# Patient Record
Sex: Female | Born: 1975
Health system: Southern US, Community
[De-identification: ages and names within clinical notes are randomized; demographics above are authoritative.]

## PROBLEM LIST (undated history)

## (undated) DIAGNOSIS — F419 Anxiety disorder, unspecified: Secondary | ICD-10-CM

## (undated) DIAGNOSIS — L509 Urticaria, unspecified: Secondary | ICD-10-CM

## (undated) HISTORY — DX: Urticaria, unspecified: L50.9

## (undated) HISTORY — PX: TUBAL LIGATION: SHX77

---

## 2004-09-08 ENCOUNTER — Emergency Department (HOSPITAL_COMMUNITY): Admission: EM | Admit: 2004-09-08 | Discharge: 2004-09-08 | Payer: Self-pay | Admitting: Emergency Medicine

## 2005-01-25 ENCOUNTER — Emergency Department (HOSPITAL_COMMUNITY): Admission: EM | Admit: 2005-01-25 | Discharge: 2005-01-25 | Payer: Self-pay | Admitting: Emergency Medicine

## 2008-08-06 ENCOUNTER — Inpatient Hospital Stay (HOSPITAL_COMMUNITY): Admission: AD | Admit: 2008-08-06 | Discharge: 2008-08-07 | Payer: Self-pay | Admitting: Obstetrics & Gynecology

## 2008-09-08 ENCOUNTER — Inpatient Hospital Stay (HOSPITAL_COMMUNITY): Admission: AD | Admit: 2008-09-08 | Discharge: 2008-09-08 | Payer: Self-pay | Admitting: Obstetrics and Gynecology

## 2009-02-27 ENCOUNTER — Inpatient Hospital Stay (HOSPITAL_COMMUNITY): Admission: AD | Admit: 2009-02-27 | Discharge: 2009-02-27 | Payer: Self-pay | Admitting: Obstetrics and Gynecology

## 2009-03-09 ENCOUNTER — Inpatient Hospital Stay (HOSPITAL_COMMUNITY): Admission: AD | Admit: 2009-03-09 | Discharge: 2009-03-12 | Payer: Self-pay | Admitting: Obstetrics & Gynecology

## 2009-03-09 ENCOUNTER — Encounter (INDEPENDENT_AMBULATORY_CARE_PROVIDER_SITE_OTHER): Payer: Self-pay | Admitting: Obstetrics & Gynecology

## 2009-03-18 ENCOUNTER — Ambulatory Visit: Payer: Self-pay | Admitting: Obstetrics and Gynecology

## 2009-03-18 ENCOUNTER — Inpatient Hospital Stay (HOSPITAL_COMMUNITY): Admission: AD | Admit: 2009-03-18 | Discharge: 2009-03-18 | Payer: Self-pay | Admitting: Obstetrics and Gynecology

## 2009-04-04 ENCOUNTER — Ambulatory Visit: Admission: RE | Admit: 2009-04-04 | Discharge: 2009-04-04 | Payer: Self-pay | Admitting: Obstetrics & Gynecology

## 2009-05-26 ENCOUNTER — Emergency Department (HOSPITAL_BASED_OUTPATIENT_CLINIC_OR_DEPARTMENT_OTHER): Admission: EM | Admit: 2009-05-26 | Discharge: 2009-05-26 | Payer: Self-pay | Admitting: Emergency Medicine

## 2010-07-28 ENCOUNTER — Emergency Department (HOSPITAL_BASED_OUTPATIENT_CLINIC_OR_DEPARTMENT_OTHER)
Admission: EM | Admit: 2010-07-28 | Discharge: 2010-07-28 | Payer: Self-pay | Source: Home / Self Care | Admitting: Emergency Medicine

## 2010-10-27 LAB — URINALYSIS, ROUTINE W REFLEX MICROSCOPIC
Hgb urine dipstick: NEGATIVE
Nitrite: NEGATIVE
Specific Gravity, Urine: 1.028 (ref 1.005–1.030)
Urobilinogen, UA: 1 mg/dL (ref 0.0–1.0)
pH: 6.5 (ref 5.0–8.0)

## 2010-11-21 LAB — CBC
Hemoglobin: 8.7 g/dL — ABNORMAL LOW (ref 12.0–15.0)
MCV: 78 fL (ref 78.0–100.0)
RBC: 3.44 MIL/uL — ABNORMAL LOW (ref 3.87–5.11)
WBC: 12.2 10*3/uL — ABNORMAL HIGH (ref 4.0–10.5)

## 2010-11-22 LAB — CBC
HCT: 28.2 % — ABNORMAL LOW (ref 36.0–46.0)
HCT: 34.9 % — ABNORMAL LOW (ref 36.0–46.0)
Hemoglobin: 9.5 g/dL — ABNORMAL LOW (ref 12.0–15.0)
MCHC: 33.8 g/dL (ref 30.0–36.0)
MCV: 77.3 fL — ABNORMAL LOW (ref 78.0–100.0)
RBC: 4.51 MIL/uL (ref 3.87–5.11)
RDW: 16.1 % — ABNORMAL HIGH (ref 11.5–15.5)
WBC: 13.3 10*3/uL — ABNORMAL HIGH (ref 4.0–10.5)

## 2010-11-30 LAB — URINALYSIS, ROUTINE W REFLEX MICROSCOPIC
Bilirubin Urine: NEGATIVE
Hgb urine dipstick: NEGATIVE
Ketones, ur: 40 mg/dL — AB
Protein, ur: NEGATIVE mg/dL
Urobilinogen, UA: 0.2 mg/dL (ref 0.0–1.0)

## 2010-12-29 NOTE — Discharge Summary (Signed)
Joanna Cross, Joanna Cross               ACCOUNT NO.:  [id number]   MEDICAL RECORD NO.:  [id number]          PATIENT TYPE:  INP   LOCATION:  9144                          FACILITY:  [facility]   PHYSICIAN:  Randye Lobo, M.D.   DATE OF BIRTH:  May 16, 1976   DATE OF ADMISSION:  03/09/2009  DATE OF DISCHARGE:  03/12/2009                               DISCHARGE SUMMARY   FINAL DIAGNOSIS:  Intrauterine pregnancy at 38-6/7th weeks' gestation,  history of previous cesarean section.  The patient desires repeat  cesarean section.  The patient desires permanent tubal sterilization,  active labor.   PROCEDURE:  Repeat low-transverse cesarean section and bilateral tubal  ligation.   SURGEON:  Gerrit Friends. Aldona Bar, MD   COMPLICATIONS:  None.   HISTORY OF PRESENT ILLNESS:  This 35 year old G2, P1-0-0-1 had a  previous cesarean section with her last pregnancy and desires repeat  with this pregnancy as well.  The patient's antepartum course up to this  point had been uncomplicated.  She was positive for group B strep.  The  patient had a repeat cesarean section scheduled at 39 weeks' gestation.  The patient started having active labor a day prior.  The patient was  taken to the operating room on March 09, 2009 by Dr. Annamaria Helling where a  repeat low-transverse cesarean section was performed with the delivery  of a 6 pounds 14 ounces female infant with Apgars of 8 and 8.  Delivery  went without complications.  There were some adhesions noted from the  lower segment of the uterus to the peritoneum which were lysed without  difficulty.  The patient had also been given 2 grams of ampicillin prior  to delivery secondary to positive group B strep status.  After delivery,  the patient still expressed her desire for permanent sterilization which  was performed by performing a bilateral tubal ligation.  Procedure and  delivery went without complications.  The patient's postoperative course  was benign without any  significant fevers.  The patient was sent home on  postoperative day #3.  The patient had an allergy to CODEINE.  She was  sent home on ibuprofen and ketorolac.  The patient was also told she  could use ibuprofen as an alternative.  She was to follow up in our  office in 6 weeks.  Instructions and precautions were reviewed with the  patient.   DISCHARGE LABORATORY DATA:  The patient had a hemoglobin of 9.5, white  blood cell count of 17.9, and platelets of 188,000.      Leilani Able, P.A.-C.      Randye Lobo, M.D.  Electronically Signed    MB/MEDQ  D:  03/30/2009  T:  03/31/2009  Job:  [id number]

## 2010-12-29 NOTE — Op Note (Signed)
Joanna Cross, Joanna Cross               ACCOUNT NO.:  [id number]   MEDICAL RECORD NO.:  [id number]          PATIENT TYPE:  INP   LOCATION:  9144                          FACILITY:  [facility]   PHYSICIAN:  Gerrit Friends. Aldona Bar, M.D.   DATE OF BIRTH:  March 27, 1976   DATE OF PROCEDURE:  03/09/2009  DATE OF DISCHARGE:                               OPERATIVE REPORT   PREOPERATIVE DIAGNOSES:  38-week and 6-day intrauterine pregnancy,  previous cesarean section, active labor, desire for repeat cesarean  section and tubal sterilization procedure.   POSTOPERATIVE DIAGNOSES:  38-week and 6-day intrauterine pregnancy,  previous cesarean section, active labor, desire for repeat cesarean  section and tubal sterilization procedure.  Delivery of 6 pounds 14  ounces female infant, Apgars 8 and 8 and pathology pending on segments  of each fallopian tube and adhesions from the lower segment of the  uterus to the peritoneum.   SURGEON:  Gerrit Friends. Aldona Bar, MD   PROCEDURES:  Repeat low transverse cesarean section, lysis of adhesions,  and tubal sterilization procedure.   ANESTHESIA:  Spinal.   HISTORY:  This 35 year old gravida 2, para 1, who had a previous C-  section elsewhere, presented 1 day prior to when she was scheduled for  repeat cesarean section at 45 weeks' gestation - she was 38 weeks and 6  days.  At the time of presentation, was having active contractions,  which had been increasing past 4-5 hours.  She was desirous of  proceeding with a repeat cesarean section as she was in active labor and  she also requested, as mentioned in the office antenatally, a tubal  sterilization procedure.   She was taken to the operating room at this time for delivery by repeat  cesarean section at 38 weeks and 6 days - an active labor - and desirous  of tubal sterilization procedure, understanding such procedure was meant  to be 100% permanent, unfortunately, it was not 100% perfect -  subsequent pregnancy can result.   OPERATIVE PROCEDURE:  The patient did receive 2 g of ampicillin IV in  maternity admissions because she had a positive group B strep culture  antenatally.  She was taken to the operating room, where spinal  anesthetic was carried out without difficulty, and thereafter she was  placed in the supine position slightly tilted left.  She was prepped and  draped in usual fashion with a Foley catheter placed.  She did receive 1  g of Ancef intravenously as well prior to procedure.   Once the patient was adequately draped and good anesthetic levels were  documented, procedure was begun.  A Pfannenstiel incision was made  through the old incision, dissected down sharply to and through the  fascia in a low transverse fashion with hemostasis created at each  layer.  Subfascial space was created inferiorly and superiorly, muscles  separated in midline, peritoneum was identified and entered  appropriately.  It became apparent there were some significant adhesions  from the lower segment of the uterus to the anterior peritoneum, and  after appropriate identification of all structures, these were taken  down sharply, it was permitting exposure to the lower segment to  facilitate delivery.  Once these adhesions were taken down, the bladder  blade was placed, and thereafter using Metzenbaum scissors in low  transverse fashion, the uterine cavity was entered - amniotomy created  with production of clear fluid; and thereafter from vertex position; a  viable female infant, which cried spontaneously once it was delivered.  After the cord was clamped and cut, the infant was passed off the  awaiting team, headed up by Dr. Alison Murray and the baby was subsequently  taken to the nursery in good condition.  Subsequent weight was found to  be 6 pounds 14 ounces and Apgars were noted to be 8 and 8.   Cord bloods were collected.  Placenta was delivered intact.  At this  time, the uterus was exteriorized and rendered free  of any remaining  products of conception.  Good uterine contractility was afforded and was  slowly given intravenous Pitocin and manual stimulation.  The lower  segment was fairly thick, but nonetheless it was closed first using a  single layer of #1 Vicryl in a running locking fashion.  This was  oversewn with multiple figure-of-eight #1 Vicryls for additional  hemostasis and to provide second layer closure.   At this time, the incision was dry.  Attention was turned each fallopian  tube, which appeared normal in its course as did both ovaries.  A  Pomeroy tubal sterilization procedure was carried out in usual fashion -  a knuckle of tube was elevated and a free suture of #1 plain catgut  suture tied down about the knuckle and the knuckle was excised and sent  to Pathology and this was carried out on both the right and left  fallopian tubes.  Hemostasis was adequate at each site.  At this time,  the uterus incision was inspected again and noted to be dry.  Uterus was  replaced in the abdominal cavity after the abdomen lavaged of all free  blood and clot.  All counts at this time noted to be correct and no  foreign bodies noted to be remaining in the abdominal cavity.  At this  time, incision was reinspected as were the tubal sterilization sites and  there were noted to be dry.  Closure of the abdomen was then carried out  in layers.  The abdominal peritoneum was closed with 0 Vicryl in a  running fashion and the muscle secured with same.  Assured of good  subfascial hemostasis, the fascia was reapproximated using 0 Vicryl from  angle to midline bilaterally.  Subcutaneous tissues were rendered  hemostatic and staples were then used to close skin and sterile pressure  dressing was applied.  Estimated blood loss 700 mL.  All counts correct  x2.   Pathologic specimen consist of segment of each fallopian tube.  At the  conclusion of the procedure, both mother and baby were doing well in   respective recovery areas.   In summary, this patient presented at 38 weeks and 6 days as a scheduled  C-section be done on March 10, 2009.  Because she was in active labor, it  was felt that it was indicated to proceed with delivery, and  accordingly, she was taken to the operating room where she underwent a  repeat low transverse cesarean section, lysis of adhesions, and tubal  sterilization procedure.  Again, all counts were correct x2.      Gerrit Friends. Aldona Bar, M.D.  Electronically  Signed     RMW/MEDQ  D:  03/09/2009  T:  03/10/2009  Job:  [id number]

## 2011-02-16 ENCOUNTER — Emergency Department (HOSPITAL_BASED_OUTPATIENT_CLINIC_OR_DEPARTMENT_OTHER)
Admission: EM | Admit: 2011-02-16 | Discharge: 2011-02-16 | Disposition: A | Discharge status: Home / Self Care | Payer: 59 | Attending: Emergency Medicine | Admitting: Emergency Medicine

## 2011-02-16 ENCOUNTER — Emergency Department (INDEPENDENT_AMBULATORY_CARE_PROVIDER_SITE_OTHER): Payer: 59

## 2011-02-16 DIAGNOSIS — W010XXA Fall on same level from slipping, tripping and stumbling without subsequent striking against object, initial encounter: Secondary | ICD-10-CM | POA: Insufficient documentation

## 2011-02-16 DIAGNOSIS — M1710 Unilateral primary osteoarthritis, unspecified knee: Secondary | ICD-10-CM

## 2011-02-16 DIAGNOSIS — M25569 Pain in unspecified knee: Secondary | ICD-10-CM

## 2011-02-16 DIAGNOSIS — IMO0002 Reserved for concepts with insufficient information to code with codable children: Secondary | ICD-10-CM | POA: Insufficient documentation

## 2011-02-16 DIAGNOSIS — X500XXA Overexertion from strenuous movement or load, initial encounter: Secondary | ICD-10-CM

## 2011-02-16 DIAGNOSIS — Y92009 Unspecified place in unspecified non-institutional (private) residence as the place of occurrence of the external cause: Secondary | ICD-10-CM | POA: Insufficient documentation

## 2011-04-21 ENCOUNTER — Emergency Department (HOSPITAL_BASED_OUTPATIENT_CLINIC_OR_DEPARTMENT_OTHER)
Admission: EM | Admit: 2011-04-21 | Discharge: 2011-04-21 | Disposition: A | Discharge status: Home / Self Care | Payer: 59 | Attending: Emergency Medicine | Admitting: Emergency Medicine

## 2011-04-21 ENCOUNTER — Encounter: Payer: Self-pay | Admitting: *Deleted

## 2011-04-21 ENCOUNTER — Emergency Department (INDEPENDENT_AMBULATORY_CARE_PROVIDER_SITE_OTHER): Payer: 59

## 2011-04-21 DIAGNOSIS — M25569 Pain in unspecified knee: Secondary | ICD-10-CM

## 2011-04-21 DIAGNOSIS — R609 Edema, unspecified: Secondary | ICD-10-CM

## 2011-04-21 DIAGNOSIS — M79609 Pain in unspecified limb: Secondary | ICD-10-CM | POA: Insufficient documentation

## 2011-04-21 DIAGNOSIS — M7989 Other specified soft tissue disorders: Secondary | ICD-10-CM | POA: Insufficient documentation

## 2011-04-21 DIAGNOSIS — M79606 Pain in leg, unspecified: Secondary | ICD-10-CM

## 2011-04-21 MED ORDER — IBUPROFEN 800 MG PO TABS: 800.0000 mg | ORAL_TABLET | Freq: Three times a day (TID) | ORAL | Status: AC

## 2011-04-21 NOTE — ED Notes (Signed)
Pt c/o right knee pain and states knot behind right knee

## 2011-04-21 NOTE — ED Provider Notes (Signed)
History     CSN: [id number] Arrival date & time: 04/21/2011  2:48 PM  Chief Complaint  Patient presents with  . Knee Pain   HPI Comments: 6 days of atraumatic right lower leg pain. Patient's issues as a soreness in her calf and behind her right knee denies any history of trauma. She denies any fever, chest pain, shortness of breath. She is able to bear weight and using Aleve for the pain. Today she noticed what she thinks is a knot behind her right knee it is painful to palpation. She is able to range the joint completely and has no weakness, numbness, tingling, fever or vomiting. There is no problems with any of her other joints. She is not currently on birth control.  The history is provided by the patient.    History reviewed. No pertinent past medical history.  Past Surgical History  Procedure Date  . Cesarean section   . Tubal ligation     History reviewed. No pertinent family history.  History  Substance Use Topics  . Smoking status: Never Smoker   . Smokeless tobacco: Not on file  . Alcohol Use: No    OB History    Grav Para Term Preterm Abortions TAB SAB Ect Mult Living                  Review of Systems  Constitutional: Negative for activity change and appetite change.  HENT: Negative for congestion and rhinorrhea.   Eyes: Negative for visual disturbance.  Respiratory: Negative for cough, chest tightness and shortness of breath.   Gastrointestinal: Negative for nausea, vomiting and abdominal pain.  Genitourinary: Negative for dysuria and hematuria.  Musculoskeletal: Positive for myalgias, joint swelling and arthralgias.  Neurological: Negative for weakness, numbness and headaches.  Psychiatric/Behavioral: Negative.     Physical Exam  BP 109/72  Pulse 81  Temp(Src) 98 F (36.7 C) (Oral)  Resp 16  Wt 280 lb (127.007 kg)  SpO2 100%  LMP 04/07/2011  Physical Exam  Constitutional: She is oriented to person, place, and time. She appears well-developed and  well-nourished. No distress.  HENT:  Head: Normocephalic and atraumatic.  Mouth/Throat: Oropharynx is clear and moist. No oropharyngeal exudate.  Eyes: Conjunctivae are normal. Pupils are equal, round, and reactive to light.  Neck: Normal range of motion.  Cardiovascular: Normal rate, regular rhythm and normal heart sounds.   Pulmonary/Chest: Effort normal and breath sounds normal. No respiratory distress.  Abdominal: Soft. There is no tenderness. There is no rebound and no guarding.  Musculoskeletal: Normal range of motion. She exhibits no edema and no tenderness.       TTP R posterior calf with small nodule in popliteal fossa.  +2 DP and PT pulses.  No motor or sensory deficits.  No ligament laxity.  Flexor and extensor function intact.   Neurological: She is alert and oriented to person, place, and time. No cranial nerve deficit.  Skin: Skin is warm.    ED Course  Procedures  MDM Knee and calf pain.  FROM of knee joint without ligament laxity. Neurovascularly intact. Doppler LE to r/o DVT  US Venous Img Lower Unilateral Right  04/21/2011  *RADIOLOGY REPORT*  Clinical Data: Right posterior knee pain/swelling  RIGHT LOWER EXTREMITY VENOUS DUPLEX ULTRASOUND  Technique:  Gray-scale sonography with graded compression, as well as color Doppler and duplex ultrasound, were performed to evaluate the deep venous system of the lower extremity from the level of the common femoral vein through the popliteal  and proximal calf veins. Spectral Doppler was utilized to evaluate flow at rest and with distal augmentation maneuvers.  Comparison:  None.  Findings: The visualized right lower extremity deep venous system appears patent.  Normal compressibility.  Patent color Doppler flow.  Satisfactory spectral Doppler with respiratory variation and response to augmentation.  The greater saphenous vein, where visualized, is patent and compressible.  IMPRESSION: No deep venous thrombosis in the right lower  extremity.  Original Report Authenticated By: Charline Bills, M.D.        Glynn Octave, MD 04/21/11 [phone]

## 2011-05-21 LAB — COMPREHENSIVE METABOLIC PANEL
ALT: 12 U/L (ref 0–35)
CO2: 23 mEq/L (ref 19–32)
Calcium: 9.1 mg/dL (ref 8.4–10.5)
Creatinine, Ser: 0.63 mg/dL (ref 0.4–1.2)
GFR calc non Af Amer: >60 mL/min (ref >60)
Glucose, Bld: 91 mg/dL (ref 70–99)
Sodium: 133 mEq/L — ABNORMAL LOW (ref 135–145)

## 2011-05-21 LAB — URINE MICROSCOPIC-ADD ON

## 2011-05-21 LAB — URINALYSIS, ROUTINE W REFLEX MICROSCOPIC
Ketones, ur: 40 mg/dL — AB
Leukocytes, UA: NEGATIVE
Nitrite: NEGATIVE
pH: 6 (ref 5.0–8.0)

## 2012-07-05 IMAGING — US US EXTREM LOW VENOUS*R*
1 series · 14 of 18 positions shown · non-contrast
Comparison: None.

CLINICAL DATA: Right posterior knee pain/swelling

RIGHT LOWER EXTREMITY VENOUS DUPLEX ULTRASOUND
TECHNIQUE: Gray-scale sonography with graded compression, as well
as color Doppler and duplex ultrasound, were performed to evaluate
the deep venous system of the lower extremity from the level of the
common femoral vein through the popliteal and proximal calf veins.
Spectral Doppler was utilized to evaluate flow at rest and with
distal augmentation maneuvers.

[Series 1: us extrem low venous*right* · 14 of 18 slices shown]
[im 1/18]
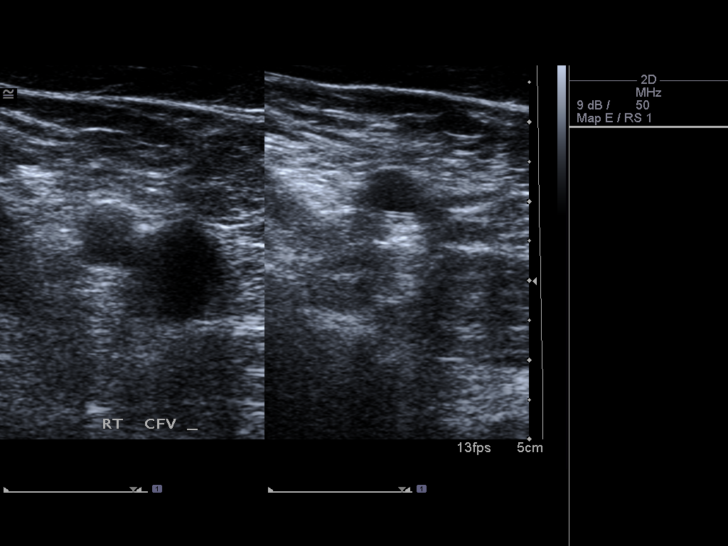
[im 2/18]
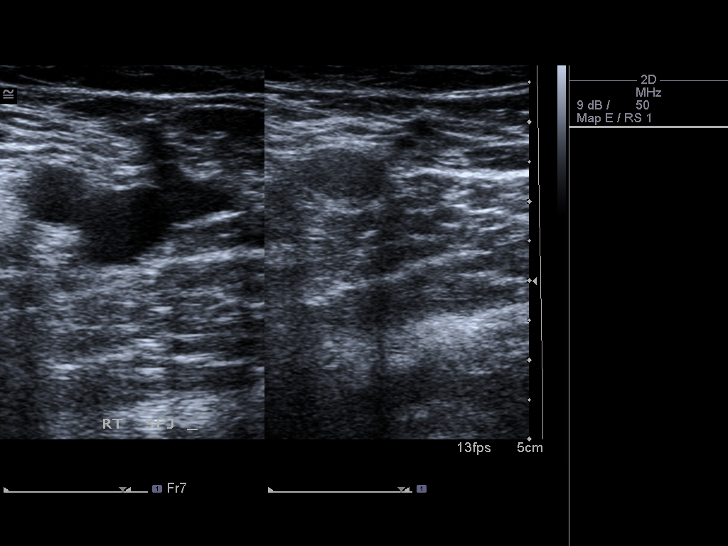
[im 4/18]
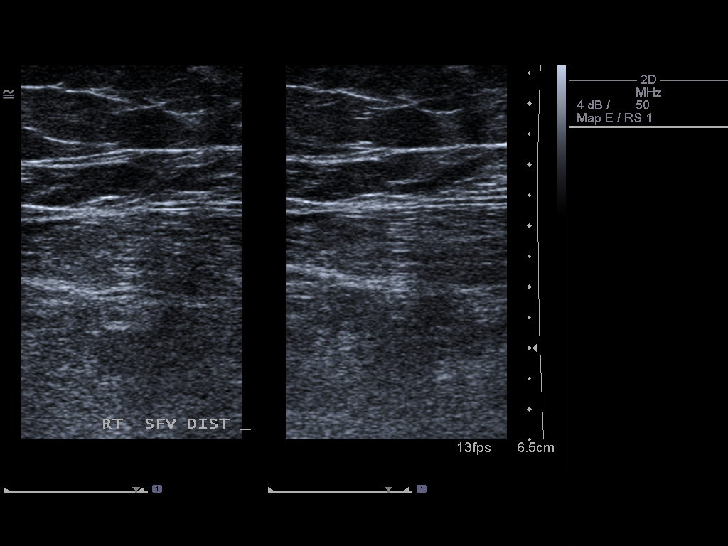
[im 5/18]
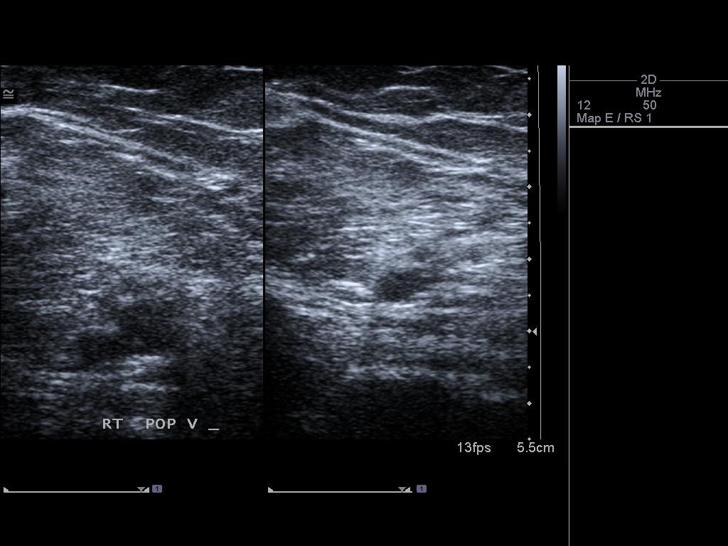
[im 6/18]
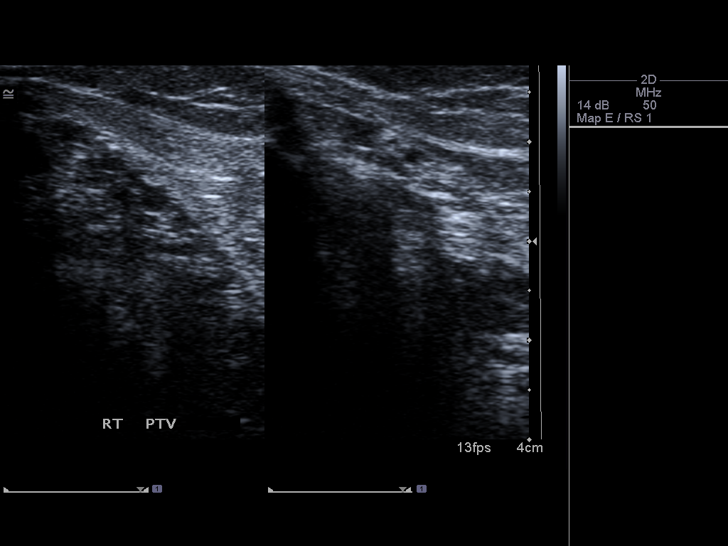
[im 8/18]
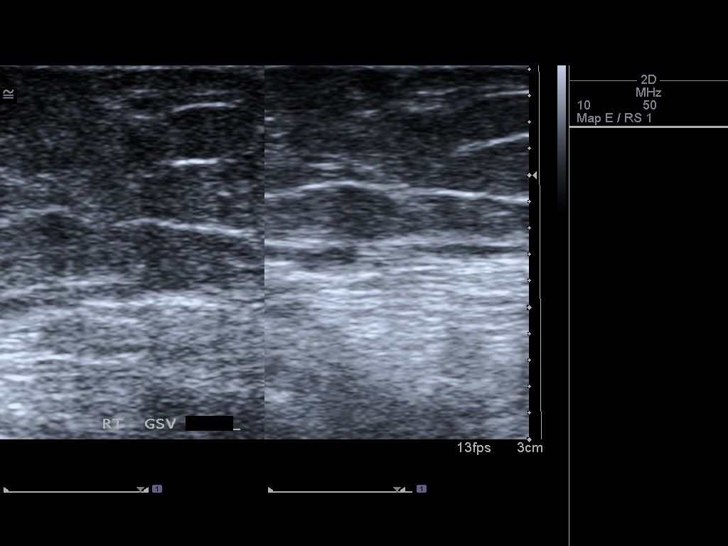
[im 9/18]
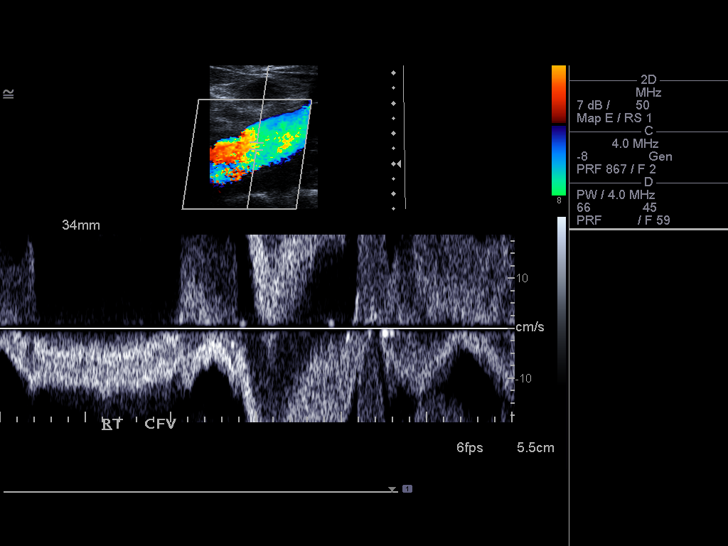
[im 10/18]
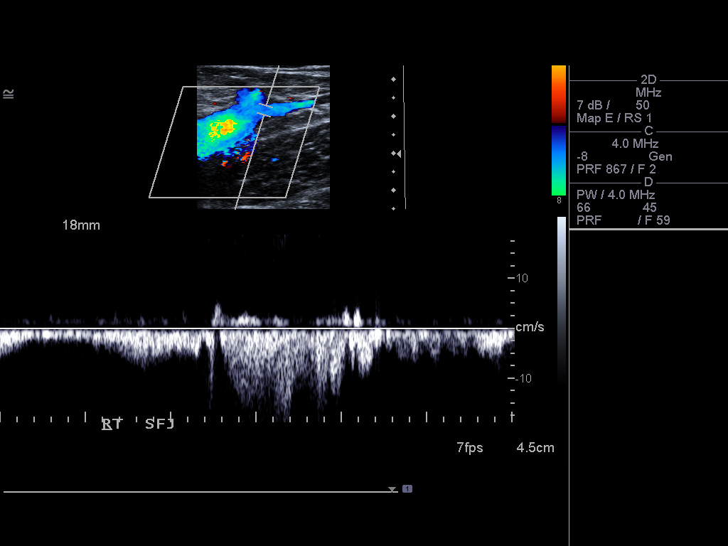
[im 11/18]
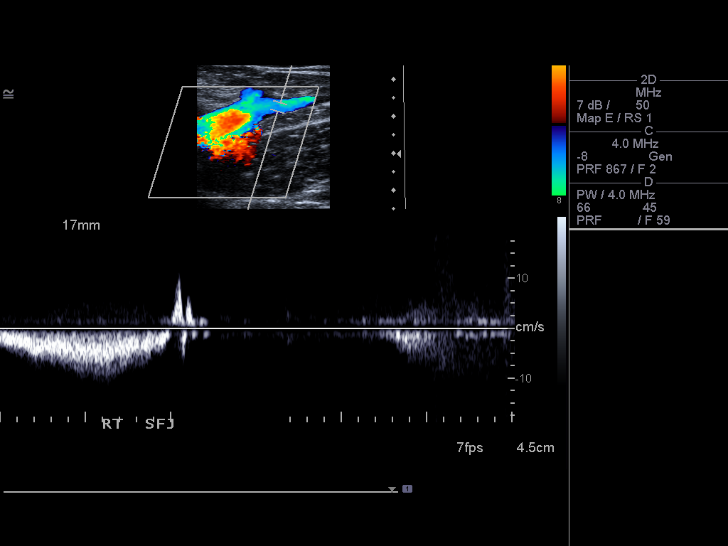
[im 13/18]
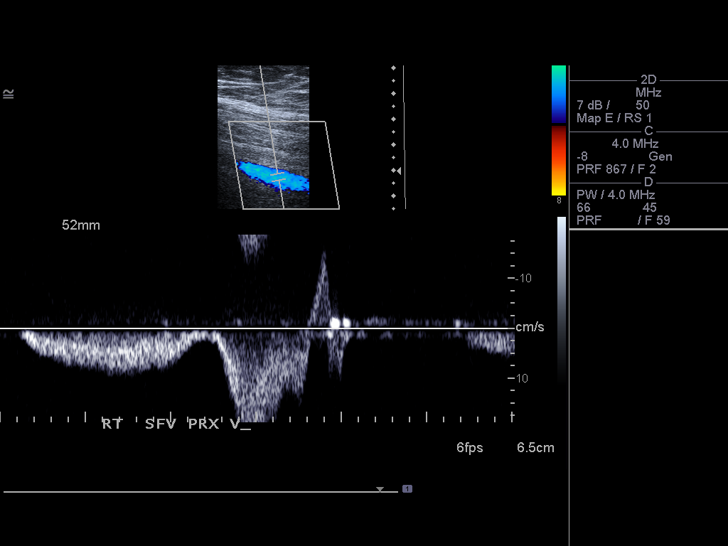
[im 14/18]
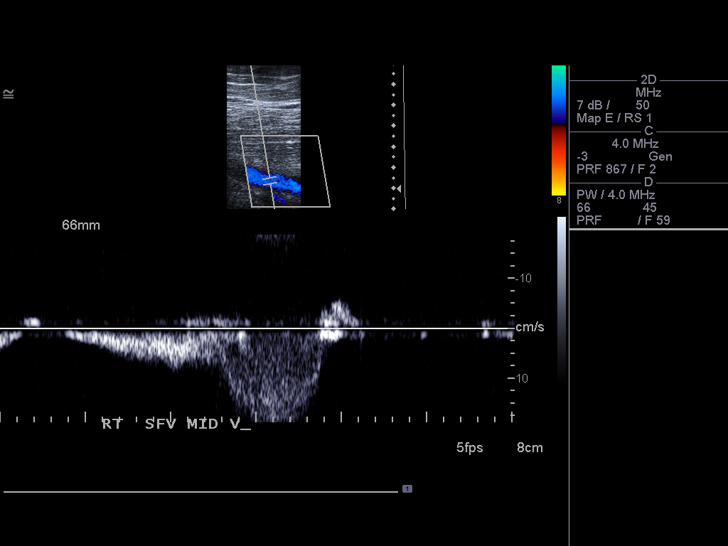
[im 15/18]
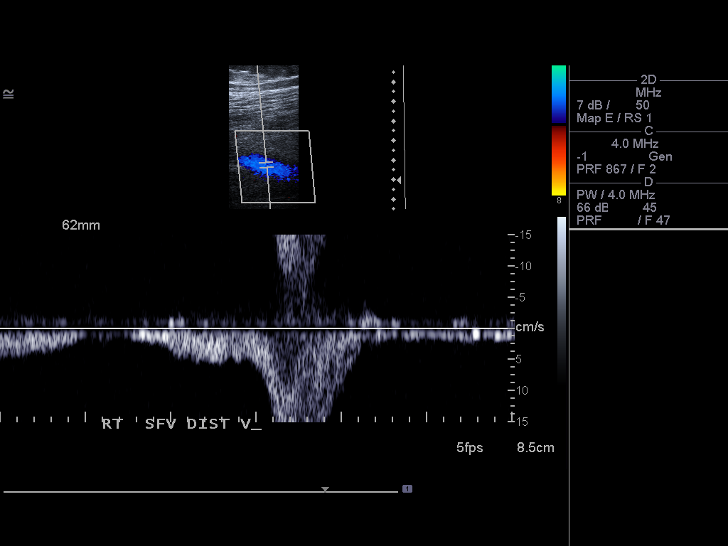
[im 17/18]
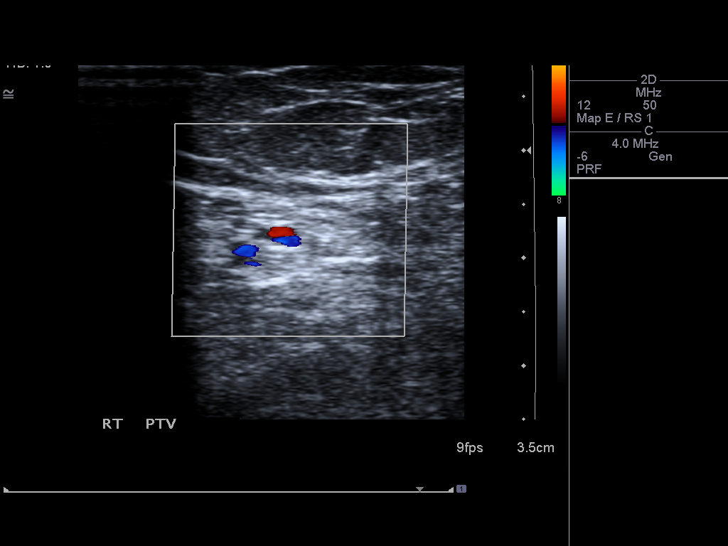
[im 18/18]
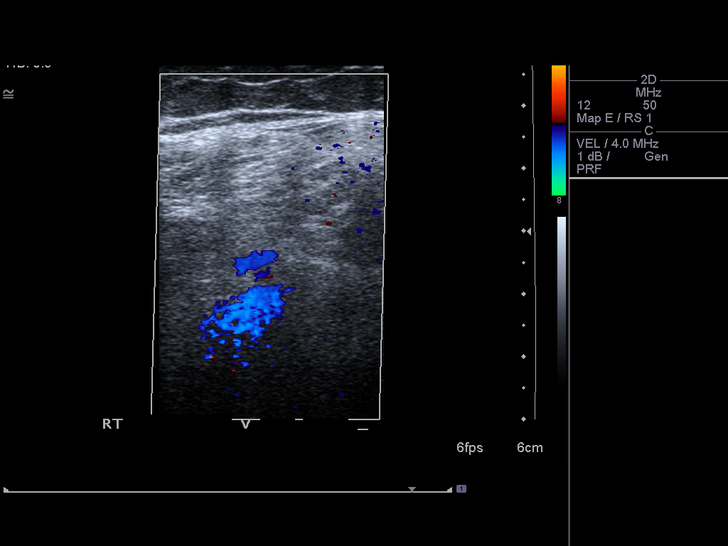

[14 of 18 positions shown; findings below may reference images not displayed]

FINDINGS: The visualized right lower extremity deep venous system
appears patent.

Normal compressibility.  Patent color Doppler flow.  Satisfactory
spectral Doppler with respiratory variation and response to
augmentation.

The greater saphenous vein, where visualized, is patent and
compressible.
IMPRESSION: No deep venous thrombosis in the right lower extremity.

## 2012-11-07 ENCOUNTER — Emergency Department (HOSPITAL_BASED_OUTPATIENT_CLINIC_OR_DEPARTMENT_OTHER)
Admission: EM | Admit: 2012-11-07 | Discharge: 2012-11-07 | Payer: 59 | Attending: Emergency Medicine | Admitting: Emergency Medicine

## 2012-11-07 ENCOUNTER — Encounter (HOSPITAL_BASED_OUTPATIENT_CLINIC_OR_DEPARTMENT_OTHER): Payer: Self-pay | Admitting: Emergency Medicine

## 2012-11-07 ENCOUNTER — Emergency Department (HOSPITAL_BASED_OUTPATIENT_CLINIC_OR_DEPARTMENT_OTHER): Payer: 59

## 2012-11-07 DIAGNOSIS — G43909 Migraine, unspecified, not intractable, without status migrainosus: Secondary | ICD-10-CM

## 2012-11-07 DIAGNOSIS — R112 Nausea with vomiting, unspecified: Secondary | ICD-10-CM | POA: Insufficient documentation

## 2012-11-07 DIAGNOSIS — Z3202 Encounter for pregnancy test, result negative: Secondary | ICD-10-CM | POA: Insufficient documentation

## 2012-11-07 MED ORDER — DEXAMETHASONE SODIUM PHOSPHATE 10 MG/ML IJ SOLN
10.0000 mg | Freq: Once | INTRAMUSCULAR | Status: AC
  Administered 2012-11-07: 10 mg via INTRAMUSCULAR
  Filled 2012-11-07: qty 1

## 2012-11-07 MED ORDER — ONDANSETRON 8 MG PO TBDP
8.0000 mg | ORAL_TABLET | Freq: Once | ORAL | Status: AC
  Administered 2012-11-07: 8 mg via ORAL
  Filled 2012-11-07: qty 1

## 2012-11-07 MED ORDER — KETOROLAC TROMETHAMINE 60 MG/2ML IM SOLN
60.0000 mg | Freq: Once | INTRAMUSCULAR | Status: AC
  Administered 2012-11-07: 60 mg via INTRAMUSCULAR
  Filled 2012-11-07: qty 2

## 2012-11-07 NOTE — ED Notes (Signed)
Voided Urine specimen obtained and sent

## 2012-11-07 NOTE — ED Provider Notes (Signed)
History     CSN: [id number]  Arrival date & time 11/07/12  0102   First MD Initiated Contact with Patient 11/07/12 0118      Chief Complaint  Patient presents with  . Nausea  . Headache  . Emesis    (Consider location/radiation/quality/duration/timing/severity/associated sxs/prior treatment) Patient is a 37 y.o. female presenting with headaches. The history is provided by the patient.  Headache Pain location:  Frontal Quality:  Dull Radiates to:  Does not radiate Onset quality:  Gradual Timing:  Constant Progression:  Unchanged Chronicity:  Recurrent Context: not defecating and not straining   Relieved by:  Nothing Worsened by:  Nothing tried Ineffective treatments:  None tried Associated symptoms: nausea   Associated symptoms: no abdominal pain, no dizziness, no ear pain, no pain, no fever, no neck pain, no neck stiffness, no numbness, no paresthesias, no photophobia and no vomiting   Nausea:    Severity:  Mild   Onset quality:  Gradual   Timing:  Constant   Progression:  Unchanged   History reviewed. No pertinent past medical history.  Past Surgical History  Procedure Laterality Date  . Cesarean section    . Tubal ligation      No family history on file.  History  Substance Use Topics  . Smoking status: Never Smoker   . Smokeless tobacco: Not on file  . Alcohol Use: No    OB History   Grav Para Term Preterm Abortions TAB SAB Ect Mult Living                  Review of Systems  Constitutional: Negative for fever.  HENT: Negative for ear pain, neck pain and neck stiffness.   Eyes: Negative for photophobia and pain.  Gastrointestinal: Positive for nausea. Negative for vomiting and abdominal pain.  Skin: Negative for rash.  Neurological: Positive for headaches. Negative for dizziness, facial asymmetry, speech difficulty, weakness, light-headedness, numbness and paresthesias.  All other systems reviewed and are negative.    Allergies   Codeine  Home Medications   Current Outpatient Rx  Name  Route  Sig  Dispense  Refill  . ibuprofen (ADVIL,MOTRIN) 200 MG tablet   Oral   Take 200 mg by mouth once.           BP 139/79  Pulse 99  Temp(Src) 98.7 F (37.1 C) (Oral)  Resp 18  Ht 5' 7.5" (1.715 m)  Wt 260 lb (117.935 kg)  BMI 40.1 kg/m2  SpO2 100%  Physical Exam  Constitutional: She is oriented to person, place, and time. She appears well-developed and well-nourished. No distress.  HENT:  Head: Normocephalic and atraumatic.  Right Ear: External ear normal.  Left Ear: External ear normal.  Mouth/Throat: Oropharynx is clear and moist. No oropharyngeal exudate.  No temporal pain  Eyes: Conjunctivae and EOM are normal. Pupils are equal, round, and reactive to light.  Neck: Normal range of motion. Neck supple.  No meningeal signs  Cardiovascular: Normal rate, regular rhythm and intact distal pulses.   Pulmonary/Chest: Effort normal and breath sounds normal. She has no wheezes. She has no rales.  Abdominal: Soft. Bowel sounds are normal. There is no tenderness. There is no rebound.  Musculoskeletal: Normal range of motion.  Lymphadenopathy:    She has no cervical adenopathy.  Neurological: She is alert and oriented to person, place, and time. She has normal reflexes. She displays normal reflexes. No cranial nerve deficit.  Skin: Skin is warm and dry.  Psychiatric: She  has a normal mood and affect.    ED Course  Procedures (including critical care time)  Labs Reviewed - No data to display No results found.   No diagnosis found.    MDM  Not sudden onset no neck pain no rash no photophobia,  No worst headache of life.  No indications for LP.  Pain medication and follow up with your family doctor.  Return for fever, stiff neck, rashes weakness or numbness      Markedly improved post medication, cn 2-12 intact follow up with your family doctor  Torria Fromer K Joeph Szatkowski-Rasch, MD 11/07/12 [phone]

## 2012-11-07 NOTE — ED Notes (Signed)
Pt c/o nausea that started sun. Headache started mon with vomiting.

## 2014-05-26 ENCOUNTER — Encounter (HOSPITAL_BASED_OUTPATIENT_CLINIC_OR_DEPARTMENT_OTHER): Payer: Self-pay | Admitting: Emergency Medicine

## 2014-05-26 ENCOUNTER — Emergency Department (HOSPITAL_BASED_OUTPATIENT_CLINIC_OR_DEPARTMENT_OTHER)
Admission: EM | Admit: 2014-05-26 | Discharge: 2014-05-26 | Disposition: A | Discharge status: Home / Self Care | Payer: 59 | Attending: Emergency Medicine | Admitting: Emergency Medicine

## 2014-05-26 DIAGNOSIS — Z3202 Encounter for pregnancy test, result negative: Secondary | ICD-10-CM | POA: Diagnosis not present

## 2014-05-26 DIAGNOSIS — Z791 Long term (current) use of non-steroidal anti-inflammatories (NSAID): Secondary | ICD-10-CM | POA: Insufficient documentation

## 2014-05-26 DIAGNOSIS — Z792 Long term (current) use of antibiotics: Secondary | ICD-10-CM | POA: Diagnosis not present

## 2014-05-26 DIAGNOSIS — N390 Urinary tract infection, site not specified: Secondary | ICD-10-CM | POA: Diagnosis not present

## 2014-05-26 DIAGNOSIS — R109 Unspecified abdominal pain: Secondary | ICD-10-CM | POA: Diagnosis present

## 2014-05-26 LAB — URINALYSIS, ROUTINE W REFLEX MICROSCOPIC
BILIRUBIN URINE: NEGATIVE
Glucose, UA: NEGATIVE mg/dL
Ketones, ur: NEGATIVE mg/dL
Leukocytes, UA: NEGATIVE
NITRITE: NEGATIVE
PROTEIN: NEGATIVE mg/dL
Specific Gravity, Urine: 1.028 (ref 1.005–1.030)
UROBILINOGEN UA: 0.2 mg/dL (ref 0.0–1.0)
pH: 5.5 (ref 5.0–8.0)

## 2014-05-26 LAB — URINE MICROSCOPIC-ADD ON

## 2014-05-26 LAB — PREGNANCY, URINE: Preg Test, Ur: NEGATIVE

## 2014-05-26 MED ORDER — CEPHALEXIN 500 MG PO CAPS: 500.0000 mg | ORAL_CAPSULE | Freq: Four times a day (QID) | ORAL | Status: DC

## 2014-05-26 MED ORDER — HYDROCODONE-ACETAMINOPHEN 5-325 MG PO TABS: 2.0000 | ORAL_TABLET | ORAL | Status: DC | PRN

## 2014-05-26 NOTE — ED Notes (Signed)
abd pain since Thursday with vaginal d/c

## 2014-05-26 NOTE — Discharge Instructions (Signed)

## 2014-05-27 NOTE — ED Provider Notes (Signed)
CSN: [id number]     Arrival date & time 05/26/14  1824 History   First MD Initiated Contact with Patient 05/26/14 2059     Chief Complaint  Patient presents with  . Abdominal Pain   (Consider location/radiation/quality/duration/timing/severity/associated sxs/prior Treatment) Patient is a 38 y.o. female presenting with abdominal pain. The history is provided by the patient. No language interpreter was used.  Abdominal Pain This is a new problem. The problem occurs constantly. The problem has been gradually improving. Associated symptoms include abdominal pain. Pertinent negatives include no headaches. Nothing aggravates the symptoms. She has tried nothing for the symptoms. The treatment provided no relief.  no std risk.   Pt feels like she has a uti  History reviewed. No pertinent past medical history. Past Surgical History  Procedure Laterality Date  . Cesarean section    . Tubal ligation     No family history on file. History  Substance Use Topics  . Smoking status: Never Smoker   . Smokeless tobacco: Never Used  . Alcohol Use: No   OB History   Grav Para Term Preterm Abortions TAB SAB Ect Mult Living                 Review of Systems  Gastrointestinal: Positive for abdominal pain.  Neurological: Negative for headaches.  All other systems reviewed and are negative.   Allergies  Codeine  Home Medications   Prior to Admission medications   Medication Sig Start Date End Date Taking? Authorizing Provider  ibuprofen (ADVIL,MOTRIN) 200 MG tablet Take 200 mg by mouth once.   Yes Historical Provider, MD  cephALEXin (KEFLEX) 500 MG capsule Take 1 capsule (500 mg total) by mouth 4 (four) times daily. 05/26/14   Elson AreasLeslie K Gerrad Welker, PA-C  HYDROcodone-acetaminophen (NORCO/VICODIN) 5-325 MG per tablet Take 2 tablets by mouth every 4 (four) hours as needed. 05/26/14   Elson AreasLeslie K Carnetta Losada, PA-C   BP 120/51  Pulse 86  SpO2 100%  LMP 05/07/2014 Physical Exam  Nursing note and vitals  reviewed. Constitutional: She is oriented to person, place, and time. She appears well-developed and well-nourished.  HENT:  Head: Normocephalic.  Eyes: EOM are normal.  Neck: Normal range of motion.  Cardiovascular: Normal rate and normal heart sounds.   Pulmonary/Chest: Effort normal.  Abdominal: Soft. She exhibits no distension. There is no tenderness.  Musculoskeletal: Normal range of motion.  Neurological: She is alert and oriented to person, place, and time.  Skin: Skin is warm.  Psychiatric: She has a normal mood and affect.    ED Course  Procedures (including critical care time) Labs Review Labs Reviewed  URINALYSIS, ROUTINE W REFLEX MICROSCOPIC - Abnormal; Notable for the following:    APPearance CLOUDY (*)    Hgb urine dipstick TRACE (*)    All other components within normal limits  URINE MICROSCOPIC-ADD ON - Abnormal; Notable for the following:    Squamous Epithelial / LPF FEW (*)    Bacteria, UA MANY (*)    All other components within normal limits  PREGNANCY, URINE    Imaging Review No results found.   MDM   1. UTI (lower urinary tract infection)    Hydrocodone Keflex AVS    Elson AreasLeslie K Kareem Aul, PA-C 05/27/14 [phone]

## 2014-05-30 NOTE — ED Provider Notes (Signed)
Medical screening examination/treatment/procedure(s) were performed by non-physician practitioner and as supervising physician I was immediately available for consultation/collaboration.    Nelia Shiobert L Colburn Asper, MD 05/30/14 [phone]

## 2014-09-01 ENCOUNTER — Encounter (HOSPITAL_BASED_OUTPATIENT_CLINIC_OR_DEPARTMENT_OTHER): Payer: Self-pay | Admitting: *Deleted

## 2014-09-01 ENCOUNTER — Emergency Department (HOSPITAL_BASED_OUTPATIENT_CLINIC_OR_DEPARTMENT_OTHER)
Admission: EM | Admit: 2014-09-01 | Discharge: 2014-09-01 | Disposition: A | Discharge status: Home / Self Care | Payer: 59 | Attending: Emergency Medicine | Admitting: Emergency Medicine

## 2014-09-01 DIAGNOSIS — J029 Acute pharyngitis, unspecified: Secondary | ICD-10-CM

## 2014-09-01 DIAGNOSIS — Z792 Long term (current) use of antibiotics: Secondary | ICD-10-CM | POA: Insufficient documentation

## 2014-09-01 DIAGNOSIS — H9209 Otalgia, unspecified ear: Secondary | ICD-10-CM | POA: Diagnosis not present

## 2014-09-01 LAB — RAPID STREP SCREEN (MED CTR MEBANE ONLY): STREPTOCOCCUS, GROUP A SCREEN (DIRECT): NEGATIVE

## 2014-09-01 MED ORDER — IBUPROFEN 800 MG PO TABS
800.0000 mg | ORAL_TABLET | Freq: Once | ORAL | Status: AC
  Administered 2014-09-01: 800 mg via ORAL
  Filled 2014-09-01: qty 1

## 2014-09-01 MED ORDER — HYDROCODONE-ACETAMINOPHEN 7.5-325 MG/15ML PO SOLN: 15.0000 mL | ORAL | Status: DC | PRN

## 2014-09-01 NOTE — ED Provider Notes (Signed)
CSN: [id number]     Arrival date & time 09/01/14  1527 History   This chart was scribed for Ethelda ChickMartha K Linker, MD by Abel PrestoKara Demonbreun, ED Scribe. This patient was seen in room MH06/MH06 and the patient's care was started at 3:45 PM.    Chief Complaint  Patient presents with  . Sore Throat  . Otalgia     Patient is a 39 y.o. female presenting with pharyngitis and ear pain. The history is provided by the patient. No language interpreter was used.  Sore Throat This is a new problem. The current episode started yesterday. The problem occurs constantly. The problem has been gradually worsening. The symptoms are aggravated by swallowing and drinking.  Otalgia Associated symptoms: fever and sore throat    HPI Comments: Joanna Cross is a 39 y.o. female who presents to the Emergency Department complaining of a worsening sore throat with onset yesterday. Pt notes pain worsens with swallowing.  She states she has been unable to tolerate much liquid due to pain. She notes associated otalgia. Pt states her son was sick with a stomach virus last week. Pt has taken Tylenol and used lozenges for relief. Pt is allergic to codeine. Pt denies known fever until arrival to ED.   History reviewed. No pertinent past medical history. Past Surgical History  Procedure Laterality Date  . Cesarean section    . Tubal ligation     No family history on file. History  Substance Use Topics  . Smoking status: Never Smoker   . Smokeless tobacco: Never Used  . Alcohol Use: No   OB History    No data available     Review of Systems  Constitutional: Positive for fever.  HENT: Positive for ear pain and sore throat.   All other systems reviewed and are negative.     Allergies  Codeine  Home Medications   Prior to Admission medications   Medication Sig Start Date End Date Taking? Authorizing Provider  cephALEXin (KEFLEX) 500 MG capsule Take 1 capsule (500 mg total) by mouth 4 (four) times daily. 05/26/14    Elson AreasLeslie K Sofia, PA-C  HYDROcodone-acetaminophen (HYCET) 7.5-325 mg/15 ml solution Take 15 mLs by mouth every 4 (four) hours as needed for moderate pain or severe pain. 09/01/14   Ethelda ChickMartha K Linker, MD  ibuprofen (ADVIL,MOTRIN) 200 MG tablet Take 200 mg by mouth once.    Historical Provider, MD   BP 138/84 mmHg  Pulse 104  Temp(Src) 100.9 F (38.3 C) (Oral)  Resp 20  Ht 5\' 7"  (1.702 m)  Wt 260 lb (117.935 kg)  BMI 40.71 kg/m2  SpO2 100% Physical Exam  Constitutional: She is oriented to person, place, and time. She appears well-developed and well-nourished.  HENT:  Head: Normocephalic.  Right Ear: Tympanic membrane normal.  Left Ear: Tympanic membrane normal.  Mouth/Throat: Uvula is midline and mucous membranes are normal. Oropharyngeal exudate and posterior oropharyngeal erythema present.  Tonsillar enlargement palate symmetric  Eyes: Conjunctivae are normal.  Neck: Normal range of motion. Neck supple.  Cardiovascular: Normal rate, regular rhythm, normal heart sounds and intact distal pulses.  Exam reveals no friction rub.   No murmur heard. Pulmonary/Chest: Effort normal and breath sounds normal. No respiratory distress. She has no wheezes. She has no rales.  Musculoskeletal: Normal range of motion.  Lymphadenopathy:    She has cervical adenopathy.  Neurological: She is alert and oriented to person, place, and time.  Skin: Skin is warm and dry.  Psychiatric: She has  a normal mood and affect. Her behavior is normal.  Nursing note and vitals reviewed.   ED Course  Procedures (including critical care time) DIAGNOSTIC STUDIES: Oxygen Saturation is 100% on room air, normal by my interpretation.    COORDINATION OF CARE: 3:49 PM Discussed treatment plan with patient at beside, the patient agrees with the plan and has no further questions at this time.   Labs Review Labs Reviewed  RAPID STREP SCREEN  CULTURE, GROUP A STREP    Imaging Review No results found.   EKG  Interpretation None      MDM   Final diagnoses:  Exudative pharyngitis    Pt presenting with c/o sore throat, nasal congestion as well as left ear pain- symptoms started yesterday. Pt also c/o mild cough.   Low grade fever associated.  Rapid strep negative, no signs of OM on exam, likely referred pain from throat.  Pt given rx for hycet for throat pain, also encouraged to use ibuprofen, drink liquids.  Strep culture pending.  Discharged with strict return precautions.  Pt agreeable with plan.   I personally performed the services described in this documentation, which was scribed in my presence. The recorded information has been reviewed and is accurate.     Ethelda Chick, MD 09/01/14 [phone]

## 2014-09-01 NOTE — ED Notes (Addendum)
Sore throat x 1 day "burning"- also left ear pain

## 2014-09-01 NOTE — Discharge Instructions (Signed)
Return to the ED with any concerns including difficulty breathing or swallowing, vomiting and not able to keep down liquids, decreased level of alertness/lethargy, or any other alarming symptoms °

## 2014-09-04 LAB — CULTURE, GROUP A STREP

## 2014-09-05 ENCOUNTER — Telehealth (HOSPITAL_COMMUNITY): Payer: Self-pay

## 2014-09-05 NOTE — Progress Notes (Signed)
ED Antimicrobial Stewardship Positive Culture Follow Up   Joanna BachCherie Cross is an 39 y.o. female who [facility] with a chief complaint of  Chief Complaint  Patient presents with  . Sore Throat  . Otalgia    Recent Results (from the past 720 hour(s))  Rapid strep screen     Status: None   Collection Time: 09/01/14  3:40 PM  Result Value Ref Range Status   Streptococcus, Group A Screen (Direct) NEGATIVE NEGATIVE Final    Comment: (NOTE) A Rapid Antigen test may result negative if the antigen level in the sample is below the detection level of this test. The FDA has not cleared this test as a stand-alone test therefore the rapid antigen negative result has reflexed to a Group A Strep culture.   Culture, Group A Strep     Status: None   Collection Time: 09/01/14  3:40 PM  Result Value Ref Range Status   Specimen Description THROAT  Final   Special Requests NONE  Final   Culture   Final    GROUP A STREP (S.PYOGENES) ISOLATED Performed at [facility]    Report Status 09/04/2014 FINAL  Final     [x]  Patient discharged originally without antimicrobial agent and treatment is now indicated  New antibiotic prescription: amoxicillin 500mg  po BID x 10 days  ED Provider: Donetta PottsBeth Tysinger, PA-C   [name], [name] 09/05/2014, 11:34 AM Infectious Diseases Pharmacist Phone# [phone]

## 2014-09-05 NOTE — Telephone Encounter (Signed)
Post ED Visit - Positive Culture Follow-up: Successful Patient Follow-Up  Culture assessed and recommendations reviewed by: []  Wes Dulaney, Pharm.D., BCPS [x]  Celedonio MiyamotoJeremy Frens, Pharm.D., BCPS []  Georgina PillionElizabeth Martin, Pharm.D., BCPS []  IlionMinh Pham, VermontPharm.D., BCPS, AAHIVP []  Estella HuskMichelle Turner, Pharm.D., BCPS, AAHIVP []  Red ChristiansSamson Lee, Pharm.D. []  Tennis Mustassie Stewart, Pharm.D.  Positive Throat culture, Group A Strep  [x]  Patient discharged without antimicrobial prescription and treatment is now indicated []  Organism is resistant to prescribed ED discharge antimicrobial []  Patient with positive blood cultures  Changes discussed with ED provider: E. Tysinger NP New antibiotic prescription "Amoxicillin 500 mg po BID x 10 days" [phone]  Contacted patient, date 09/05/2014, time 12:42 Pts mom informed.  Rx given to RPh.   Arvid RightClark, Yuvan Medinger Dorn 09/05/2014, 12:55 PM

## 2015-06-10 ENCOUNTER — Emergency Department (HOSPITAL_COMMUNITY): Payer: 59

## 2015-06-10 ENCOUNTER — Emergency Department (HOSPITAL_COMMUNITY)
Admission: EM | Admit: 2015-06-10 | Discharge: 2015-06-10 | Disposition: A | Discharge status: Home / Self Care | Payer: 59 | Attending: Emergency Medicine | Admitting: Emergency Medicine

## 2015-06-10 ENCOUNTER — Encounter (HOSPITAL_COMMUNITY): Payer: Self-pay | Admitting: Emergency Medicine

## 2015-06-10 DIAGNOSIS — Y9389 Activity, other specified: Secondary | ICD-10-CM | POA: Insufficient documentation

## 2015-06-10 DIAGNOSIS — Y92410 Unspecified street and highway as the place of occurrence of the external cause: Secondary | ICD-10-CM | POA: Insufficient documentation

## 2015-06-10 DIAGNOSIS — S8991XA Unspecified injury of right lower leg, initial encounter: Secondary | ICD-10-CM | POA: Insufficient documentation

## 2015-06-10 DIAGNOSIS — R0789 Other chest pain: Secondary | ICD-10-CM

## 2015-06-10 DIAGNOSIS — M79604 Pain in right leg: Secondary | ICD-10-CM

## 2015-06-10 DIAGNOSIS — Z792 Long term (current) use of antibiotics: Secondary | ICD-10-CM | POA: Diagnosis not present

## 2015-06-10 DIAGNOSIS — S299XXA Unspecified injury of thorax, initial encounter: Secondary | ICD-10-CM | POA: Insufficient documentation

## 2015-06-10 DIAGNOSIS — M79605 Pain in left leg: Secondary | ICD-10-CM

## 2015-06-10 DIAGNOSIS — S8992XA Unspecified injury of left lower leg, initial encounter: Secondary | ICD-10-CM | POA: Insufficient documentation

## 2015-06-10 DIAGNOSIS — Z23 Encounter for immunization: Secondary | ICD-10-CM | POA: Diagnosis not present

## 2015-06-10 DIAGNOSIS — Y998 Other external cause status: Secondary | ICD-10-CM | POA: Insufficient documentation

## 2015-06-10 MED ORDER — IBUPROFEN 200 MG PO TABS
600.0000 mg | ORAL_TABLET | Freq: Once | ORAL | Status: AC
  Administered 2015-06-10: 600 mg via ORAL
  Filled 2015-06-10: qty 3

## 2015-06-10 MED ORDER — TETANUS-DIPHTH-ACELL PERTUSSIS 5-2.5-18.5 LF-MCG/0.5 IM SUSP
0.5000 mL | Freq: Once | INTRAMUSCULAR | Status: AC
  Administered 2015-06-10: 0.5 mL via INTRAMUSCULAR
  Filled 2015-06-10: qty 0.5
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
## 2015-06-10 NOTE — ED Notes (Signed)
[facility] will administer motrin, tdap, and ice when pt return.

## 2015-06-21 ENCOUNTER — Encounter (HOSPITAL_BASED_OUTPATIENT_CLINIC_OR_DEPARTMENT_OTHER): Payer: Self-pay | Admitting: Emergency Medicine

## 2015-06-21 ENCOUNTER — Emergency Department (HOSPITAL_BASED_OUTPATIENT_CLINIC_OR_DEPARTMENT_OTHER): Payer: 59

## 2015-06-21 ENCOUNTER — Emergency Department (HOSPITAL_BASED_OUTPATIENT_CLINIC_OR_DEPARTMENT_OTHER)
Admission: EM | Admit: 2015-06-21 | Discharge: 2015-06-21 | Disposition: A | Discharge status: Home / Self Care | Payer: 59 | Attending: Physician Assistant | Admitting: Physician Assistant

## 2015-06-21 DIAGNOSIS — J069 Acute upper respiratory infection, unspecified: Secondary | ICD-10-CM | POA: Diagnosis not present

## 2015-06-21 DIAGNOSIS — R05 Cough: Secondary | ICD-10-CM | POA: Diagnosis present

## 2015-06-21 MED ORDER — GUAIFENESIN 100 MG/5ML PO LIQD: 100.0000 mg | ORAL | Status: DC | PRN

## 2015-06-21 MED ORDER — BENZONATATE 100 MG PO CAPS: 100.0000 mg | ORAL_CAPSULE | Freq: Three times a day (TID) | ORAL | Status: DC | PRN

## 2015-06-21 MED ORDER — IBUPROFEN 800 MG PO TABS: 800.0000 mg | ORAL_TABLET | Freq: Three times a day (TID) | ORAL | Status: DC

## 2015-06-21 NOTE — ED Provider Notes (Signed)
CSN: [id number]     Arrival date & time 06/21/15  1546 History  By signing my name below, I, Arianna Nassar, attest that this documentation has been prepared under the direction and in the presence of Tesha Archambeau Randall AnLyn Destenee Guerry, MD. Electronically Signed: Octavia HeirArianna Nassar, ED Scribe. 06/21/2015. 5:28 PM.     Chief Complaint  Patient presents with  . Cough      The history is provided by the patient. No language interpreter was used.   HPI Comments: Joanna Cross is a 39 y.o. female who presents to the Emergency Department complaining of intermittent, gradual worsening cough onset 3 days ago. She has been having associated nasal congestion. She has been coughing up mucus after having a hard cough. Pt denies fever. Pt is allergic to codeine.  History reviewed. No pertinent past medical history. Past Surgical History  Procedure Laterality Date  . Cesarean section    . Tubal ligation     History reviewed. No pertinent family history. Social History  Substance Use Topics  . Smoking status: Never Smoker   . Smokeless tobacco: Never Used  . Alcohol Use: No   OB History    No data available     Review of Systems  Constitutional: Negative for fever.  HENT: Positive for congestion.   Respiratory: Positive for cough.   All other systems reviewed and are negative.     Allergies  Codeine  Home Medications   Prior to Admission medications   Medication Sig Start Date End Date Taking? Authorizing Provider  cephALEXin (KEFLEX) 500 MG capsule Take 1 capsule (500 mg total) by mouth 4 (four) times daily. 05/26/14   Elson AreasLeslie K Sofia, PA-C  HYDROcodone-acetaminophen (HYCET) 7.5-325 mg/15 ml solution Take 15 mLs by mouth every 4 (four) hours as needed for moderate pain or severe pain. 09/01/14   Jerelyn ScottMartha Linker, MD  ibuprofen (ADVIL,MOTRIN) 200 MG tablet Take 200 mg by mouth once.    Historical Provider, MD   Triage vitals: BP 133/83 mmHg  Pulse 96  Temp(Src) 98.3 F (36.8 C) (Oral)  Resp 18  Ht  5\' 8"  (1.727 m)  Wt 250 lb (113.399 kg)  BMI 38.02 kg/m2  SpO2 100%  LMP 05/15/2015 Physical Exam  Constitutional: She is oriented to person, place, and time. She appears well-developed and well-nourished. No distress.  HENT:  Head: Normocephalic and atraumatic.  Mild erythema in nares, erythema to posterior oropharynx, TMs normal on left and right  Eyes: EOM are normal. Pupils are equal, round, and reactive to light.  Neck: Normal range of motion.  Cardiovascular: Normal rate, regular rhythm and normal heart sounds.   Pulmonary/Chest: Effort normal and breath sounds normal.  Abdominal: Soft. She exhibits no distension. There is no tenderness.  Musculoskeletal: Normal range of motion.  Neurological: She is alert and oriented to person, place, and time.  Skin: Skin is warm and dry.  Psychiatric: She has a normal mood and affect. Judgment normal.  Nursing note and vitals reviewed.   ED Course  Procedures  DIAGNOSTIC STUDIES: Oxygen Saturation is 100% on RA, normal by my interpretation.  COORDINATION OF CARE:  5:27 PM Discussed treatment plan with pt at bedside and pt agreed to plan.  Labs Review Labs Reviewed - No data to display  Imaging Review Dg Chest 2 View  06/21/2015  CLINICAL DATA:  39 year old female with history of cough and congestion for 2 days. No history of fever. EXAM: CHEST  2 VIEW COMPARISON:  Chest x-ray 06/10/2015. FINDINGS: Lung volumes are  normal. No consolidative airspace disease. No pleural effusions. No pneumothorax. No pulmonary nodule or mass noted. Pulmonary vasculature and the cardiomediastinal silhouette are within normal limits. IMPRESSION: No radiographic evidence of acute cardiopulmonary disease. Electronically Signed   By: Trudie Reed M.D.   On: 06/21/2015 16:50   I have personally reviewed and evaluated these images and lab results as part of my medical decision-making.   EKG Interpretation None      MDM   Final diagnoses:  None      Patient is a 39 year old female presenting with cough and congestion. It started recently. Patient has no fever. Patient, eating, drinking normally. Sounds typical for a upper risk for infection. Patient appears well hydrated on exam. She has normal vital signs. Chest x-ray shows no evidence of pneumonia. We will treat patient symptomatically. We will have her follow-up with her primary care physician.   I personally performed the services described in this documentation, which was scribed in my presence. The recorded information has been reviewed and is accurate.    Aaria Happ Randall An, MD 06/21/15 [phone]

## 2015-06-21 NOTE — ED Notes (Signed)
Patient states that she has had a cough with typical cold symtoms x 2 -3 days. Today she reports that she coughs and then "throws up" mucus after a hard cough

## 2015-06-21 NOTE — Discharge Instructions (Signed)
Cough, Adult °A cough helps to clear your throat and lungs. A cough may last only 2-3 weeks (acute), or it may last longer than 8 weeks (chronic). Many different things can cause a cough. A cough may be a sign of an illness or another medical condition. °HOME CARE °· Pay attention to any changes in your cough. °· Take medicines only as told by your doctor. °· If you were prescribed an antibiotic medicine, take it as told by your doctor. Do not stop taking it even if you start to feel better. °· Talk with your doctor before you try using a cough medicine. °· Drink enough fluid to keep your pee (urine) clear or pale yellow. °· If the air is dry, use a cold steam vaporizer or humidifier in your home. °· Stay away from things that make you cough at work or at home. °· If your cough is worse at night, try using extra pillows to raise your head up higher while you sleep. °· Do not smoke, and try not to be around smoke. If you need help quitting, ask your doctor. °· Do not have caffeine. °· Do not drink alcohol. °· Rest as needed. °GET HELP IF: °· You have new problems (symptoms). °· You cough up yellow fluid (pus). °· Your cough does not get better after 2-3 weeks, or your cough gets worse. °· Medicine does not help your cough and you are not sleeping well. °· You have pain that gets worse or pain that is not helped with medicine. °· You have a fever. °· You are losing weight and you do not know why. °· You have night sweats. °GET HELP RIGHT AWAY IF: °· You cough up blood. °· You have trouble breathing. °· Your heartbeat is very fast. °  °This information is not intended to replace advice given to you by your health care provider. Make sure you discuss any questions you have with your health care provider. °  °Document Released: 04/15/2011 Document Revised: 04/23/2015 Document Reviewed: 10/09/2014 °Elsevier Interactive Patient Education ©2016 Elsevier Inc. ° °Upper Respiratory Infection, Adult °Most upper respiratory  infections (URIs) are caused by a virus. A URI affects the nose, throat, and upper air passages. The most common type of URI is often called "the common cold." °HOME CARE  °· Take medicines only as told by your doctor. °· Gargle warm saltwater or take cough drops to comfort your throat as told by your doctor. °· Use a warm mist humidifier or inhale steam from a shower to increase air moisture. This may make it easier to breathe. °· Drink enough fluid to keep your pee (urine) clear or pale yellow. °· Eat soups and other clear broths. °· Have a healthy diet. °· Rest as needed. °· Go back to work when your fever is gone or your doctor says it is okay. °· You may need to stay home longer to avoid giving your URI to others. °· You can also wear a face mask and wash your hands often to prevent spread of the virus. °· Use your inhaler more if you have asthma. °· Do not use any tobacco products, including cigarettes, chewing tobacco, or electronic cigarettes. If you need help quitting, ask your doctor. °GET HELP IF: °· You are getting worse, not better. °· Your symptoms are not helped by medicine. °· You have chills. °· You are getting more short of breath. °· You have brown or red mucus. °· You have yellow or brown discharge from   your nose. °· You have pain in your face, especially when you bend forward. °· You have a fever. °· You have puffy (swollen) neck glands. °· You have pain while swallowing. °· You have white areas in the back of your throat. °GET HELP RIGHT AWAY IF:  °· You have very bad or constant: °· Headache. °· Ear pain. °· Pain in your forehead, behind your eyes, and over your cheekbones (sinus pain). °· Chest pain. °· You have long-lasting (chronic) lung disease and any of the following: °· Wheezing. °· Long-lasting cough. °· Coughing up blood. °· A change in your usual mucus. °· You have a stiff neck. °· You have changes in your: °· Vision. °· Hearing. °· Thinking. °· Mood. °MAKE SURE YOU:  °· Understand  these instructions. °· Will watch your condition. °· Will get help right away if you are not doing well or get worse. °  °This information is not intended to replace advice given to you by your health care provider. Make sure you discuss any questions you have with your health care provider. °  °Document Released: 01/19/2008 Document Revised: 12/17/2014 Document Reviewed: 11/07/2013 °Elsevier Interactive Patient Education ©2016 Elsevier Inc. ° ° °

## 2015-09-21 ENCOUNTER — Emergency Department (HOSPITAL_BASED_OUTPATIENT_CLINIC_OR_DEPARTMENT_OTHER)
Admission: EM | Admit: 2015-09-21 | Discharge: 2015-09-21 | Disposition: A | Discharge status: Home / Self Care | Payer: 59 | Attending: Emergency Medicine | Admitting: Emergency Medicine

## 2015-09-21 ENCOUNTER — Encounter (HOSPITAL_BASED_OUTPATIENT_CLINIC_OR_DEPARTMENT_OTHER): Payer: Self-pay | Admitting: *Deleted

## 2015-09-21 ENCOUNTER — Emergency Department (HOSPITAL_BASED_OUTPATIENT_CLINIC_OR_DEPARTMENT_OTHER): Payer: 59

## 2015-09-21 DIAGNOSIS — R079 Chest pain, unspecified: Secondary | ICD-10-CM | POA: Insufficient documentation

## 2015-09-21 DIAGNOSIS — J069 Acute upper respiratory infection, unspecified: Secondary | ICD-10-CM

## 2015-09-21 DIAGNOSIS — R059 Cough, unspecified: Secondary | ICD-10-CM

## 2015-09-21 DIAGNOSIS — R05 Cough: Secondary | ICD-10-CM

## 2015-09-21 DIAGNOSIS — Z79899 Other long term (current) drug therapy: Secondary | ICD-10-CM | POA: Insufficient documentation

## 2015-09-21 MED ORDER — GUAIFENESIN-DM 100-10 MG/5ML PO SYRP: 5.0000 mL | ORAL_SOLUTION | Freq: Three times a day (TID) | ORAL | Status: DC | PRN

## 2015-09-21 NOTE — ED Notes (Signed)
Cough and fever x several days.  Ibuprofen taken PTA.

## 2015-09-21 NOTE — ED Provider Notes (Signed)
CSN: [id number]     Arrival date & time 09/21/15  2003 History  By signing my name below, I, Joanna Cross, attest that this documentation has been prepared under the direction and in the presence of Benjiman Core, MD. Electronically Signed: Bethel Cross, ED Scribe. 09/21/2015. 9:11 PM   Chief Complaint  Patient presents with  . Cough   The history is provided by the patient. No language interpreter was used.   Joanna Cross is a 40 y.o. female who presents to the Emergency Department complaining of a worsening, moderate,  cough productive of clear sputum with gradual onset 3 days ago. Associated symptoms include low-grade fever, sore throat, and rib pain. Pt states that ibuprofen and Mucinex DM have provided insufficient relief in symptoms at home. Her daughter recently got over the flu.   History reviewed. No pertinent past medical history. Past Surgical History  Procedure Laterality Date  . Cesarean section    . Tubal ligation     History reviewed. No pertinent family history. Social History  Substance Use Topics  . Smoking status: Never Smoker   . Smokeless tobacco: Never Used  . Alcohol Use: No   OB History    No data available     Review of Systems  Constitutional: Positive for fever.  HENT: Positive for sore throat.   Respiratory: Positive for cough.   Cardiovascular: Positive for chest pain.  All other systems reviewed and are negative.   Allergies  Codeine  Home Medications   Prior to Admission medications   Medication Sig Start Date End Date Taking? Authorizing Provider  sertraline (ZOLOFT) 100 MG tablet Take 100 mg by mouth daily.   Yes Historical Provider, MD  guaiFENesin-dextromethorphan (ROBITUSSIN DM) 100-10 MG/5ML syrup Take 5 mLs by mouth 3 (three) times daily as needed for cough. 09/21/15   Benjiman Core, MD   BP 124/59 mmHg  Pulse 91  Temp(Src) 100 F (37.8 C) (Oral)  Resp 20  Ht 5' 7.5" (1.715 m)  Wt 250 lb (113.399 kg)  BMI 38.56  kg/m2  SpO2 100%  LMP 09/21/2015 Physical Exam  Constitutional: She is oriented to person, place, and time. She appears well-developed and well-nourished. No distress.  HENT:  Head: Normocephalic and atraumatic.  Eyes: EOM are normal.  Neck: Normal range of motion.  Cardiovascular: Normal rate, regular rhythm and normal heart sounds.   Pulmonary/Chest: Effort normal and breath sounds normal.  Abdominal: Soft. She exhibits no distension. There is no tenderness.  Musculoskeletal: Normal range of motion.  Neurological: She is alert and oriented to person, place, and time.  Skin: Skin is warm and dry.  Psychiatric: She has a normal mood and affect. Judgment normal.  Nursing note and vitals reviewed.   ED Course  Procedures (including critical care time)  COORDINATION OF CARE: 9:08 PM Discussed treatment plan which includes CXR with pt at bedside and pt agreed to plan.  Labs Review Labs Reviewed - No data to display  Imaging Review Dg Chest 2 View  09/21/2015  CLINICAL DATA:  Cough, fever for several days EXAM: CHEST  2 VIEW COMPARISON:  06/21/2015 FINDINGS: The heart size and mediastinal contours are within normal limits. Both lungs are clear. The visualized skeletal structures are unremarkable. IMPRESSION: No active cardiopulmonary disease. Electronically Signed   By: Elige Ko   On: 09/21/2015 20:36   I have personally reviewed and evaluated these images as part of my medical decision-making.   EKG Interpretation None      MDM  Final diagnoses:  URI (upper respiratory infection)    Patient with URI. X-ray reassuring. Not hypoxic. Will discharge home.  I personally performed the services described in this documentation, which was scribed in my presence. The recorded information has been reviewed and is accurate.       Benjiman Core, MD 09/22/15 [phone]

## 2015-09-21 NOTE — Discharge Instructions (Signed)
Viral Infections °A viral infection can be caused by different types of viruses. Most viral infections are not serious and resolve on their own. However, some infections may cause severe symptoms and may lead to further complications. °SYMPTOMS °Viruses can frequently cause: °· Minor sore throat. °· Aches and pains. °· Headaches. °· Runny nose. °· Different types of rashes. °· Watery eyes. °· Tiredness. °· Cough. °· Loss of appetite. °· Gastrointestinal infections, resulting in nausea, vomiting, and diarrhea. °These symptoms do not respond to antibiotics because the infection is not caused by bacteria. However, you might catch a bacterial infection following the viral infection. This is sometimes called a "superinfection." Symptoms of such a bacterial infection may include: °· Worsening sore throat with pus and difficulty swallowing. °· Swollen neck glands. °· Chills and a high or persistent fever. °· Severe headache. °· Tenderness over the sinuses. °· Persistent overall ill feeling (malaise), muscle aches, and tiredness (fatigue). °· Persistent cough. °· Yellow, green, or brown mucus production with coughing. °HOME CARE INSTRUCTIONS  °· Only take over-the-counter or prescription medicines for pain, discomfort, diarrhea, or fever as directed by your caregiver. °· Drink enough water and fluids to keep your urine clear or pale yellow. Sports drinks can provide valuable electrolytes, sugars, and hydration. °· Get plenty of rest and maintain proper nutrition. Soups and broths with crackers or rice are fine. °SEEK IMMEDIATE MEDICAL CARE IF:  °· You have severe headaches, shortness of breath, chest pain, neck pain, or an unusual rash. °· You have uncontrolled vomiting, diarrhea, or you are unable to keep down fluids. °· You or your child has an oral temperature above 102° F (38.9° C), not controlled by medicine. °· Your baby is older than 3 months with a rectal temperature of 102° F (38.9° C) or higher. °· Your baby is 3  months old or younger with a rectal temperature of 100.4° F (38° C) or higher. °MAKE SURE YOU:  °· Understand these instructions. °· Will watch your condition. °· Will get help right away if you are not doing well or get worse. °  °This information is not intended to replace advice given to you by your health care provider. Make sure you discuss any questions you have with your health care provider. °  °Document Released: 05/12/2005 Document Revised: 10/25/2011 Document Reviewed: 01/08/2015 °Elsevier Interactive Patient Education ©2016 Elsevier Inc. ° °

## 2016-04-12 ENCOUNTER — Encounter (HOSPITAL_BASED_OUTPATIENT_CLINIC_OR_DEPARTMENT_OTHER): Payer: Self-pay | Admitting: *Deleted

## 2016-04-12 ENCOUNTER — Observation Stay (HOSPITAL_BASED_OUTPATIENT_CLINIC_OR_DEPARTMENT_OTHER)
Admission: EM | Admit: 2016-04-12 | Discharge: 2016-04-14 | Disposition: A | Discharge status: Home / Self Care | Payer: 59 | Attending: Surgery | Admitting: Surgery

## 2016-04-12 ENCOUNTER — Emergency Department (HOSPITAL_BASED_OUTPATIENT_CLINIC_OR_DEPARTMENT_OTHER): Payer: 59

## 2016-04-12 DIAGNOSIS — K8000 Calculus of gallbladder with acute cholecystitis without obstruction: Secondary | ICD-10-CM

## 2016-04-12 DIAGNOSIS — F419 Anxiety disorder, unspecified: Secondary | ICD-10-CM | POA: Diagnosis not present

## 2016-04-12 DIAGNOSIS — Z6841 Body Mass Index (BMI) 40.0 and over, adult: Secondary | ICD-10-CM | POA: Diagnosis not present

## 2016-04-12 DIAGNOSIS — Z419 Encounter for procedure for purposes other than remedying health state, unspecified: Secondary | ICD-10-CM

## 2016-04-12 DIAGNOSIS — K8010 Calculus of gallbladder with chronic cholecystitis without obstruction: Secondary | ICD-10-CM | POA: Diagnosis not present

## 2016-04-12 HISTORY — DX: Anxiety disorder, unspecified: F41.9

## 2016-04-12 LAB — URINALYSIS, ROUTINE W REFLEX MICROSCOPIC
Bilirubin Urine: NEGATIVE
Glucose, UA: NEGATIVE mg/dL
Ketones, ur: NEGATIVE mg/dL
NITRITE: NEGATIVE
PH: 6 (ref 5.0–8.0)
Protein, ur: NEGATIVE mg/dL
Specific Gravity, Urine: 1.024 (ref 1.005–1.030)

## 2016-04-12 LAB — URINE MICROSCOPIC-ADD ON

## 2016-04-12 LAB — PREGNANCY, URINE: Preg Test, Ur: NEGATIVE

## 2016-04-12 MED ORDER — ONDANSETRON HCL 4 MG/2ML IJ SOLN
4.0000 mg | Freq: Once | INTRAMUSCULAR | Status: AC
  Administered 2016-04-12: 4 mg via INTRAVENOUS
  Filled 2016-04-12: qty 2

## 2016-04-12 MED ORDER — FENTANYL CITRATE (PF) 100 MCG/2ML IJ SOLN
100.0000 ug | Freq: Once | INTRAMUSCULAR | Status: AC
  Administered 2016-04-12: 100 ug via INTRAVENOUS
  Filled 2016-04-12: qty 2

## 2016-04-12 NOTE — ED Triage Notes (Signed)
Abdominal pain x 1 week. It went away for a few days and came back today. Pain is in her right upper quadrant. Worse when she eats.

## 2016-04-12 NOTE — ED Provider Notes (Signed)
[facility] Note   CSN: [id number] Arrival date & time: 04/12/16  1929  By signing my name below, I, Joanna Cross, attest that this documentation has been prepared under the direction and in the presence of Paula Libra, MD. Electronically Signed: Aggie Cross, ED Scribe. 04/12/16. 11:04 PM.    History   Chief Complaint Chief Complaint  Patient presents with  . Abdominal Pain    The history is provided by the patient. No language interpreter was used.   HPI Comments:  Joanna Cross is a 40 y.o. female who presents to the Emergency Department complaining of postprandial abdominal pain, which started 1 week ago. Pain localized to RUQ, waxes and wanes and is rated an 8/10 currently. Associated symptoms include nausea, diarrhea, vomiting and dizziness. Pain comes on about 20-30 minutes after eating and lasts a couple hours. Denies fever, chills, vaginal bleeding or discharge, blood in urine, SOB, cough, chest pain or burning sensation with urination.   Past Medical History:  Diagnosis Date  . Anxiety     There are no active problems to display for this patient.   Past Surgical History:  Procedure Laterality Date  . CESAREAN SECTION    . TUBAL LIGATION      OB History    No data available       Home Medications    Prior to Admission medications   Medication Sig Start Date End Date Taking? Authorizing Provider  guaiFENesin-dextromethorphan (ROBITUSSIN DM) 100-10 MG/5ML syrup Take 5 mLs by mouth 3 (three) times daily as needed for cough. 09/21/15   Benjiman Core, MD  sertraline (ZOLOFT) 100 MG tablet Take 100 mg by mouth daily.    Historical Provider, MD    Family History No family history on file.  Social History Social History  Substance Use Topics  . Smoking status: Never Smoker  . Smokeless tobacco: Never Used  . Alcohol use No     Allergies   Codeine   Review of Systems Review of Systems  All other systems reviewed and are  negative.    Physical Exam Updated Vital Signs BP 119/66 (BP Location: Right Arm)   Pulse 73   Temp 98 F (36.7 C) (Oral)   Resp 18   Ht 5' 7.5" (1.715 m)   Wt 291 lb (132 kg)   LMP 04/03/2016   SpO2 99%   BMI 44.90 kg/m   Physical Exam General: Well-developed, well-nourished female in no acute distress; appearance consistent with age of record HENT: normocephalic; atraumatic Eyes: pupils equal, round and reactive to light; extraocular muscles intact Neck: supple Heart: regular rate and rhythm Lungs: clear to auscultation bilaterally Abdomen: soft; nondistended; RUQ tenderness; no masses or hepatosplenomegaly; bowel sounds present Extremities: No deformity; full range of motion; pulses normal Neurologic: Awake, alert and oriented; motor function intact in all extremities and symmetric; no facial droop Skin: Warm and dry Psychiatric: Normal mood and affect   ED Treatments / Results   Nursing notes and vitals signs, including pulse oximetry, reviewed.  Summary of this visit's results, reviewed by myself:  Labs:  Results for orders placed or performed during the hospital encounter of 04/12/16 (from the past 24 hour(s))  Urinalysis, Routine w reflex microscopic (not at [facility] [place])     Status: Abnormal   Collection Time: 04/12/16  9:50 PM  Result Value Ref Range   Color, Urine YELLOW YELLOW   APPearance CLOUDY (A) CLEAR   Specific Gravity, Urine 1.024 1.005 - 1.030   pH 6.0  5.0 - 8.0   Glucose, UA NEGATIVE NEGATIVE mg/dL   Hgb urine dipstick TRACE (A) NEGATIVE   Bilirubin Urine NEGATIVE NEGATIVE   Ketones, ur NEGATIVE NEGATIVE mg/dL   Protein, ur NEGATIVE NEGATIVE mg/dL   Nitrite NEGATIVE NEGATIVE   Leukocytes, UA LARGE (A) NEGATIVE  Pregnancy, urine     Status: None   Collection Time: 04/12/16  9:50 PM  Result Value Ref Range   Preg Test, Ur NEGATIVE NEGATIVE  Urine microscopic-add on     Status: Abnormal   Collection Time: 04/12/16  9:50 PM  Result Value Ref Range    Squamous Epithelial / LPF 6-30 (A) NONE SEEN   WBC, UA 6-30 0 - 5 WBC/hpf   RBC / HPF 0-5 0 - 5 RBC/hpf   Bacteria, UA MANY (A) NONE SEEN  CBC with Differential/Platelet     Status: Abnormal   Collection Time: 04/12/16 11:40 PM  Result Value Ref Range   WBC 12.4 (H) 4.0 - 10.5 K/uL   RBC 4.85 3.87 - 5.11 MIL/uL   Hemoglobin 10.3 (L) 12.0 - 15.0 g/dL   HCT 16.1 (L) 09.6 - 04.5 %   MCV 66.8 (L) 78.0 - 100.0 fL   MCH 21.2 (L) 26.0 - 34.0 pg   MCHC 31.8 30.0 - 36.0 g/dL   RDW 40.9 (H) 81.1 - 91.4 %   Platelets 294 150 - 400 K/uL   Neutrophils Relative % 68 %   Neutro Abs 8.4 (H) 1.7 - 7.7 K/uL   Lymphocytes Relative 24 %   Lymphs Abs 3.0 0.7 - 4.0 K/uL   Monocytes Relative 5 %   Monocytes Absolute 0.7 0.1 - 1.0 K/uL   Eosinophils Relative 2 %   Eosinophils Absolute 0.2 0.0 - 0.7 K/uL   Basophils Relative 0 %   Basophils Absolute 0.0 0.0 - 0.1 K/uL   WBC Morphology WHITE COUNT CONFIRMED ON SMEAR    RBC Morphology ELLIPTOCYTES    Smear Review PLATELET COUNT CONFIRMED BY SMEAR   Comprehensive metabolic panel     Status: Abnormal   Collection Time: 04/12/16 11:40 PM  Result Value Ref Range   Sodium 135 135 - 145 mmol/L   Potassium 3.1 (L) 3.5 - 5.1 mmol/L   Chloride 104 101 - 111 mmol/L   CO2 24 22 - 32 mmol/L   Glucose, Bld 89 65 - 99 mg/dL   BUN 14 6 - 20 mg/dL   Creatinine, Ser 7.82 0.44 - 1.00 mg/dL   Calcium 9.1 8.9 - 95.6 mg/dL   Total Protein 7.9 6.5 - 8.1 g/dL   Albumin 4.1 3.5 - 5.0 g/dL   AST 14 (L) 15 - 41 U/L   ALT 12 (L) 14 - 54 U/L   Alkaline Phosphatase 84 38 - 126 U/L   Total Bilirubin 0.1 (L) 0.3 - 1.2 mg/dL   GFR calc non Af Amer >60 >60 mL/min   GFR calc Af Amer >60 >60 mL/min   Anion gap 7 5 - 15  Lipase, blood     Status: None   Collection Time: 04/12/16 11:40 PM  Result Value Ref Range   Lipase 25 11 - 51 U/L    Imaging Studies: US Abdomen Complete  Result Date: 04/13/2016 CLINICAL DATA:  Right upper quadrant and epigastric pain for 1 week.  EXAM: ABDOMEN ULTRASOUND COMPLETE COMPARISON:  None. FINDINGS: Gallbladder: The gallbladder is contracted around calculi. The patient was mildly tender to probe pressure over the gallbladder. There probably is a small volume pericholecystic  fluid. No bile duct dilatation. Common bile duct: Diameter: 2 mm Liver: No focal liver lesions. There is mild diffusely increased hepatic echogenicity consistent with fatty infiltration. IVC: No abnormality visualized. Pancreas: Visualized portion unremarkable. Spleen: Size and appearance within normal limits. Right Kidney: Length: 11.2 cm. Echogenicity within normal limits. No mass or hydronephrosis visualized. Left Kidney: Length: 11.6 cm. Echogenicity within normal limits. No mass or hydronephrosis visualized. Abdominal aorta: No aneurysm visualized. Other findings: None. IMPRESSION: Cholelithiasis. The gallbladder is contracted around calculi, with mild tenderness over the gallbladder and with mild pericholecystic fluid. This could represent acute cholecystitis. Increased hepatic parenchymal echotexture may represent fatty infiltration. No bile duct dilatation. Electronically Signed   By: Ellery Plunkaniel R Mitchell M.D.   On: 04/13/2016 00:27   1:53 AM The patient is complaining of pain again. She was discussed with Dr. Tarri FullerGerkinDictation #1 [id number]  [id number]  of general surgery. We [facility] for likely cholecystectomy later today.  Procedures (including critical care time)  Final Clinical Impressions(s) / ED Diagnoses   Final diagnoses:  Calculus of gallbladder with acute cholecystitis without obstruction   I personally performed the services described in this documentation, which was scribed in my presence. The recorded information has been reviewed and is accurate.    Paula LibraJohn Gauge Winski, MD 04/13/16 [phone]

## 2016-04-13 ENCOUNTER — Observation Stay (HOSPITAL_COMMUNITY): Payer: 59 | Admitting: Anesthesiology

## 2016-04-13 ENCOUNTER — Encounter (HOSPITAL_COMMUNITY): Payer: Self-pay | Admitting: Surgery

## 2016-04-13 ENCOUNTER — Observation Stay (HOSPITAL_COMMUNITY): Payer: 59

## 2016-04-13 ENCOUNTER — Encounter (HOSPITAL_COMMUNITY): Admission: EM | Disposition: A | Discharge status: Home / Self Care | Payer: Self-pay | Source: Home / Self Care

## 2016-04-13 DIAGNOSIS — K8000 Calculus of gallbladder with acute cholecystitis without obstruction: Secondary | ICD-10-CM | POA: Diagnosis present

## 2016-04-13 HISTORY — PX: LAPAROSCOPIC CHOLECYSTECTOMY SINGLE SITE WITH INTRAOPERATIVE CHOLANGIOGRAM: SHX6538

## 2016-04-13 LAB — CBC WITH DIFFERENTIAL/PLATELET
BASOS PCT: 0 %
Basophils Absolute: 0 10*3/uL (ref 0.0–0.1)
Eosinophils Absolute: 0.2 10*3/uL (ref 0.0–0.7)
Eosinophils Relative: 2 %
HCT: 32.4 % — ABNORMAL LOW (ref 36.0–46.0)
Hemoglobin: 10.3 g/dL — ABNORMAL LOW (ref 12.0–15.0)
Lymphocytes Relative: 24 %
Lymphs Abs: 3 10*3/uL (ref 0.7–4.0)
MCH: 21.2 pg — ABNORMAL LOW (ref 26.0–34.0)
MCHC: 31.8 g/dL (ref 30.0–36.0)
MCV: 66.8 fL — ABNORMAL LOW (ref 78.0–100.0)
MONOS PCT: 5 %
Monocytes Absolute: 0.7 10*3/uL (ref 0.1–1.0)
NEUTROS ABS: 8.4 10*3/uL — AB (ref 1.7–7.7)
NEUTROS PCT: 68 %
Platelets: 294 10*3/uL (ref 150–400)
RBC: 4.85 MIL/uL (ref 3.87–5.11)
RDW: 17.8 % — AB (ref 11.5–15.5)
WBC: 12.4 10*3/uL — ABNORMAL HIGH (ref 4.0–10.5)

## 2016-04-13 LAB — SURGICAL PCR SCREEN
MRSA, PCR: NEGATIVE
STAPHYLOCOCCUS AUREUS: NEGATIVE

## 2016-04-13 LAB — COMPREHENSIVE METABOLIC PANEL
ALBUMIN: 4.1 g/dL (ref 3.5–5.0)
ALT: 12 U/L — ABNORMAL LOW (ref 14–54)
AST: 14 U/L — AB (ref 15–41)
Alkaline Phosphatase: 84 U/L (ref 38–126)
Anion gap: 7 (ref 5–15)
BUN: 14 mg/dL (ref 6–20)
CHLORIDE: 104 mmol/L (ref 101–111)
CO2: 24 mmol/L (ref 22–32)
Calcium: 9.1 mg/dL (ref 8.9–10.3)
Creatinine, Ser: 0.77 mg/dL (ref 0.44–1.00)
GFR calc Af Amer: >60 mL/min (ref >60)
GFR calc non Af Amer: >60 mL/min (ref >60)
GLUCOSE: 89 mg/dL (ref 65–99)
POTASSIUM: 3.1 mmol/L — AB (ref 3.5–5.1)
Sodium: 135 mmol/L (ref 135–145)
Total Bilirubin: 0.1 mg/dL — ABNORMAL LOW (ref 0.3–1.2)
Total Protein: 7.9 g/dL (ref 6.5–8.1)

## 2016-04-13 LAB — LIPASE, BLOOD: Lipase: 25 U/L (ref 11–51)

## 2016-04-13 SURGERY — LAPAROSCOPIC CHOLECYSTECTOMY SINGLE SITE WITH INTRAOPERATIVE CHOLANGIOGRAM
Anesthesia: General

## 2016-04-13 MED ORDER — KCL IN DEXTROSE-NACL 20-5-0.45 MEQ/L-%-% IV SOLN
INTRAVENOUS | Status: DC
Start: 2016-04-13 — End: 2016-04-14
  Administered 2016-04-14: via INTRAVENOUS
  Filled 2016-04-13: qty 1000

## 2016-04-13 MED ORDER — MIDAZOLAM HCL 2 MG/2ML IJ SOLN
INTRAMUSCULAR | Status: AC
  Filled 2016-04-13: qty 2

## 2016-04-13 MED ORDER — LACTATED RINGERS IV SOLN: INTRAVENOUS | Status: DC

## 2016-04-13 MED ORDER — MORPHINE SULFATE (PF) 2 MG/ML IV SOLN
1.0000 mg | INTRAVENOUS | Status: DC | PRN
  Administered 2016-04-14: 1 mg via INTRAVENOUS
  Filled 2016-04-13: qty 1

## 2016-04-13 MED ORDER — ONDANSETRON HCL 4 MG/2ML IJ SOLN
INTRAMUSCULAR | Status: AC
  Filled 2016-04-13: qty 2

## 2016-04-13 MED ORDER — SODIUM CHLORIDE 0.9 % IV SOLN: INTRAVENOUS | Status: DC

## 2016-04-13 MED ORDER — PROPOFOL 10 MG/ML IV BOLUS
INTRAVENOUS | Status: DC | PRN
  Administered 2016-04-13: 150 mg via INTRAVENOUS

## 2016-04-13 MED ORDER — SERTRALINE HCL 50 MG PO TABS
25.0000 mg | ORAL_TABLET | Freq: Every day | ORAL | Status: DC
  Administered 2016-04-14: 25 mg via ORAL
  Filled 2016-04-13: qty 1

## 2016-04-13 MED ORDER — FENTANYL CITRATE (PF) 100 MCG/2ML IJ SOLN
INTRAMUSCULAR | Status: DC | PRN
  Administered 2016-04-13: 100 ug via INTRAVENOUS
  Administered 2016-04-13 (×2): 50 ug via INTRAVENOUS

## 2016-04-13 MED ORDER — KETOROLAC TROMETHAMINE 30 MG/ML IJ SOLN
INTRAMUSCULAR | Status: AC
  Filled 2016-04-13: qty 1

## 2016-04-13 MED ORDER — ROCURONIUM BROMIDE 10 MG/ML (PF) SYRINGE
PREFILLED_SYRINGE | INTRAVENOUS | Status: DC | PRN
  Administered 2016-04-13: 50 mg via INTRAVENOUS

## 2016-04-13 MED ORDER — HYDROMORPHONE HCL 1 MG/ML IJ SOLN: 1.0000 mg | INTRAMUSCULAR | Status: DC | PRN

## 2016-04-13 MED ORDER — ONDANSETRON HCL 4 MG/2ML IJ SOLN
4.0000 mg | INTRAMUSCULAR | Status: DC | PRN
  Administered 2016-04-13: 4 mg via INTRAVENOUS
  Filled 2016-04-13: qty 2

## 2016-04-13 MED ORDER — DIPHENHYDRAMINE HCL 25 MG PO CAPS: 25.0000 mg | ORAL_CAPSULE | Freq: Four times a day (QID) | ORAL | Status: DC | PRN

## 2016-04-13 MED ORDER — LIDOCAINE 2% (20 MG/ML) 5 ML SYRINGE
INTRAMUSCULAR | Status: DC | PRN
  Administered 2016-04-13: 40 mg via INTRAVENOUS

## 2016-04-13 MED ORDER — METOCLOPRAMIDE HCL 5 MG/ML IJ SOLN
INTRAMUSCULAR | Status: AC
  Filled 2016-04-13: qty 2

## 2016-04-13 MED ORDER — BUPIVACAINE-EPINEPHRINE (PF) 0.25% -1:200000 IJ SOLN
INTRAMUSCULAR | Status: DC | PRN
  Administered 2016-04-13: 50 mL

## 2016-04-13 MED ORDER — BUPIVACAINE-EPINEPHRINE 0.25% -1:200000 IJ SOLN
INTRAMUSCULAR | Status: AC
  Filled 2016-04-13: qty 1

## 2016-04-13 MED ORDER — KCL IN DEXTROSE-NACL 20-5-0.45 MEQ/L-%-% IV SOLN
INTRAVENOUS | Status: DC
  Administered 2016-04-13: 07:00:00 via INTRAVENOUS
  Filled 2016-04-13: qty 1000

## 2016-04-13 MED ORDER — ROCURONIUM BROMIDE 10 MG/ML (PF) SYRINGE
PREFILLED_SYRINGE | INTRAVENOUS | Status: AC
  Filled 2016-04-13: qty 10

## 2016-04-13 MED ORDER — MIDAZOLAM HCL 5 MG/5ML IJ SOLN
INTRAMUSCULAR | Status: DC | PRN
  Administered 2016-04-13: 2 mg via INTRAVENOUS

## 2016-04-13 MED ORDER — FENTANYL CITRATE (PF) 100 MCG/2ML IJ SOLN
INTRAMUSCULAR | Status: AC
  Filled 2016-04-13: qty 4

## 2016-04-13 MED ORDER — LIP MEDEX EX OINT
TOPICAL_OINTMENT | CUTANEOUS | Status: AC
  Filled 2016-04-13: qty 7

## 2016-04-13 MED ORDER — LACTATED RINGERS IV SOLN
INTRAVENOUS | Status: DC | PRN
  Administered 2016-04-13 (×2): via INTRAVENOUS

## 2016-04-13 MED ORDER — ONDANSETRON HCL 4 MG/2ML IJ SOLN
4.0000 mg | Freq: Three times a day (TID) | INTRAMUSCULAR | Status: DC | PRN
  Administered 2016-04-13: 4 mg via INTRAVENOUS
  Filled 2016-04-13: qty 2

## 2016-04-13 MED ORDER — PROMETHAZINE HCL 25 MG/ML IJ SOLN: 6.2500 mg | INTRAMUSCULAR | Status: DC | PRN

## 2016-04-13 MED ORDER — MEPERIDINE HCL 50 MG/ML IJ SOLN
INTRAMUSCULAR | Status: AC
  Administered 2016-04-13: 12.5 mg via INTRAVENOUS
  Filled 2016-04-13: qty 1

## 2016-04-13 MED ORDER — BISACODYL 10 MG RE SUPP: 10.0000 mg | Freq: Two times a day (BID) | RECTAL | Status: DC | PRN

## 2016-04-13 MED ORDER — TRAMADOL HCL 50 MG PO TABS
50.0000 mg | ORAL_TABLET | Freq: Four times a day (QID) | ORAL | Status: DC | PRN
  Administered 2016-04-14 (×3): 50 mg via ORAL
  Filled 2016-04-13: qty 1
  Filled 2016-04-13: qty 2

## 2016-04-13 MED ORDER — METOPROLOL TARTRATE 12.5 MG HALF TABLET: 12.5000 mg | ORAL_TABLET | Freq: Two times a day (BID) | ORAL | Status: DC | PRN

## 2016-04-13 MED ORDER — METHOCARBAMOL 1000 MG/10ML IJ SOLN
1000.0000 mg | Freq: Four times a day (QID) | INTRAMUSCULAR | Status: DC | PRN
  Filled 2016-04-13: qty 10

## 2016-04-13 MED ORDER — SUGAMMADEX SODIUM 500 MG/5ML IV SOLN
INTRAVENOUS | Status: AC
  Filled 2016-04-13: qty 5

## 2016-04-13 MED ORDER — HYDROMORPHONE HCL 1 MG/ML IJ SOLN
1.0000 mg | INTRAMUSCULAR | Status: DC | PRN
  Administered 2016-04-13: 0.5 mg via INTRAVENOUS
  Filled 2016-04-13: qty 1

## 2016-04-13 MED ORDER — DEXAMETHASONE SODIUM PHOSPHATE 10 MG/ML IJ SOLN
INTRAMUSCULAR | Status: AC
  Filled 2016-04-13: qty 1

## 2016-04-13 MED ORDER — BUPIVACAINE LIPOSOME 1.3 % IJ SUSP
20.0000 mL | Freq: Once | INTRAMUSCULAR | Status: DC
  Filled 2016-04-13: qty 20

## 2016-04-13 MED ORDER — SUGAMMADEX SODIUM 200 MG/2ML IV SOLN
INTRAVENOUS | Status: DC | PRN
  Administered 2016-04-13: 400 mg via INTRAVENOUS

## 2016-04-13 MED ORDER — METOCLOPRAMIDE HCL 5 MG/ML IJ SOLN
INTRAMUSCULAR | Status: DC | PRN
  Administered 2016-04-13: 10 mg via INTRAVENOUS

## 2016-04-13 MED ORDER — DEXTROSE 5 % IV SOLN
2.0000 g | INTRAVENOUS | Status: DC
  Administered 2016-04-13 – 2016-04-14 (×3): 2 g via INTRAVENOUS
  Filled 2016-04-13 (×2): qty 2

## 2016-04-13 MED ORDER — ACETAMINOPHEN 325 MG PO TABS: 650.0000 mg | ORAL_TABLET | Freq: Four times a day (QID) | ORAL | Status: DC | PRN

## 2016-04-13 MED ORDER — FENTANYL CITRATE (PF) 100 MCG/2ML IJ SOLN: 25.0000 ug | INTRAMUSCULAR | Status: DC | PRN

## 2016-04-13 MED ORDER — DEXAMETHASONE SODIUM PHOSPHATE 10 MG/ML IJ SOLN
INTRAMUSCULAR | Status: DC | PRN
  Administered 2016-04-13: 10 mg via INTRAVENOUS

## 2016-04-13 MED ORDER — MAGIC MOUTHWASH
15.0000 mL | Freq: Four times a day (QID) | ORAL | Status: DC | PRN
  Filled 2016-04-13: qty 15

## 2016-04-13 MED ORDER — ONDANSETRON HCL 4 MG/2ML IJ SOLN
INTRAMUSCULAR | Status: DC | PRN
  Administered 2016-04-13: 4 mg via INTRAVENOUS

## 2016-04-13 MED ORDER — ONDANSETRON 4 MG PO TBDP: 4.0000 mg | ORAL_TABLET | Freq: Four times a day (QID) | ORAL | Status: DC | PRN

## 2016-04-13 MED ORDER — MEPERIDINE HCL 50 MG/ML IJ SOLN
12.5000 mg | Freq: Once | INTRAMUSCULAR | Status: AC
  Administered 2016-04-13: 12.5 mg via INTRAVENOUS

## 2016-04-13 MED ORDER — 0.9 % SODIUM CHLORIDE (POUR BTL) OPTIME: TOPICAL | Status: DC | PRN

## 2016-04-13 MED ORDER — MENTHOL 3 MG MT LOZG: 1.0000 | LOZENGE | OROMUCOSAL | Status: DC | PRN

## 2016-04-13 MED ORDER — FENTANYL CITRATE (PF) 100 MCG/2ML IJ SOLN
100.0000 ug | INTRAMUSCULAR | Status: DC | PRN
  Administered 2016-04-13: 100 ug via INTRAVENOUS
  Filled 2016-04-13: qty 2

## 2016-04-13 MED ORDER — PHENOL 1.4 % MT LIQD: 2.0000 | OROMUCOSAL | Status: DC | PRN

## 2016-04-13 MED ORDER — STERILE WATER FOR IRRIGATION IR SOLN
Status: DC | PRN
  Administered 2016-04-13: 1000 mL

## 2016-04-13 MED ORDER — IOPAMIDOL (ISOVUE-300) INJECTION 61%
INTRAVENOUS | Status: AC
  Filled 2016-04-13: qty 50

## 2016-04-13 MED ORDER — KCL IN DEXTROSE-NACL 20-5-0.45 MEQ/L-%-% IV SOLN
INTRAVENOUS | Status: DC
  Administered 2016-04-13: 04:00:00 via INTRAVENOUS
  Filled 2016-04-13: qty 1000

## 2016-04-13 MED ORDER — PROPOFOL 10 MG/ML IV BOLUS
INTRAVENOUS | Status: AC
  Filled 2016-04-13: qty 20

## 2016-04-13 MED ORDER — ALUM & MAG HYDROXIDE-SIMETH 200-200-20 MG/5ML PO SUSP: 30.0000 mL | Freq: Four times a day (QID) | ORAL | Status: DC | PRN

## 2016-04-13 MED ORDER — ACETAMINOPHEN 650 MG RE SUPP: 650.0000 mg | Freq: Four times a day (QID) | RECTAL | Status: DC | PRN

## 2016-04-13 MED ORDER — LACTATED RINGERS IV BOLUS (SEPSIS): 1000.0000 mL | Freq: Three times a day (TID) | INTRAVENOUS | Status: DC | PRN

## 2016-04-13 MED ORDER — DIPHENHYDRAMINE HCL 50 MG/ML IJ SOLN: 12.5000 mg | Freq: Four times a day (QID) | INTRAMUSCULAR | Status: DC | PRN

## 2016-04-13 MED ORDER — ONDANSETRON HCL 4 MG/2ML IJ SOLN: 4.0000 mg | Freq: Four times a day (QID) | INTRAMUSCULAR | Status: DC | PRN

## 2016-04-13 MED ORDER — DEXTROSE 5 % IV SOLN
INTRAVENOUS | Status: AC
  Filled 2016-04-13: qty 2

## 2016-04-13 MED ORDER — PROCHLORPERAZINE EDISYLATE 5 MG/ML IJ SOLN: 5.0000 mg | INTRAMUSCULAR | Status: DC | PRN

## 2016-04-13 MED ORDER — LIP MEDEX EX OINT
1.0000 "application " | TOPICAL_OINTMENT | Freq: Two times a day (BID) | CUTANEOUS | Status: DC
  Administered 2016-04-14 (×2): 1 via TOPICAL

## 2016-04-13 MED ORDER — SUCCINYLCHOLINE CHLORIDE 20 MG/ML IJ SOLN
INTRAMUSCULAR | Status: DC | PRN
  Administered 2016-04-13: 100 mg via INTRAVENOUS

## 2016-04-13 MED ORDER — IOPAMIDOL (ISOVUE-300) INJECTION 61%
INTRAVENOUS | Status: DC | PRN
  Administered 2016-04-13: 18 mL

## 2016-04-13 SURGICAL SUPPLY — 41 items
APPLIER CLIP 5 13 M/L LIGAMAX5 (MISCELLANEOUS) ×3
CABLE HIGH FREQUENCY MONO STRZ (ELECTRODE) ×3 IMPLANT
CHLORAPREP W/TINT 26ML (MISCELLANEOUS) ×3 IMPLANT
CLIP APPLIE 5 13 M/L LIGAMAX5 (MISCELLANEOUS) ×1 IMPLANT
COVER MAYO STAND STRL (DRAPES) ×3 IMPLANT
COVER SURGICAL LIGHT HANDLE (MISCELLANEOUS) ×3 IMPLANT
DECANTER SPIKE VIAL GLASS SM (MISCELLANEOUS) ×3 IMPLANT
DRAIN CHANNEL 19F RND (DRAIN) IMPLANT
DRAPE C-ARM 42X120 X-RAY (DRAPES) ×3 IMPLANT
DRAPE LAPAROSCOPIC ABDOMINAL (DRAPES) ×3 IMPLANT
DRAPE WARM FLUID 44X44 (DRAPE) ×3 IMPLANT
DRSG TEGADERM 4X4.75 (GAUZE/BANDAGES/DRESSINGS) ×3 IMPLANT
ELECT REM PT RETURN 9FT ADLT (ELECTROSURGICAL) ×3
ELECTRODE REM PT RTRN 9FT ADLT (ELECTROSURGICAL) ×1 IMPLANT
ENDOLOOP SUT PDS II  0 18 (SUTURE)
ENDOLOOP SUT PDS II 0 18 (SUTURE) IMPLANT
EVACUATOR SILICONE 100CC (DRAIN) IMPLANT
GAUZE SPONGE 2X2 8PLY STRL LF (GAUZE/BANDAGES/DRESSINGS) ×1 IMPLANT
GLOVE ECLIPSE 8.0 STRL XLNG CF (GLOVE) ×3 IMPLANT
GLOVE INDICATOR 8.0 STRL GRN (GLOVE) ×3 IMPLANT
GOWN STRL REUS W/TWL XL LVL3 (GOWN DISPOSABLE) ×9 IMPLANT
IRRIG SUCT STRYKERFLOW 2 WTIP (MISCELLANEOUS) ×3
IRRIGATION SUCT STRKRFLW 2 WTP (MISCELLANEOUS) ×1 IMPLANT
KIT BASIN OR (CUSTOM PROCEDURE TRAY) ×3 IMPLANT
PAD POSITIONER PINK NONSTERILE (MISCELLANEOUS) ×3 IMPLANT
PAD POSITIONING PINK XL (MISCELLANEOUS) ×3 IMPLANT
POSITIONER SURGICAL ARM (MISCELLANEOUS) IMPLANT
POUCH SPECIMEN RETRIEVAL 10MM (ENDOMECHANICALS) ×3 IMPLANT
SCISSORS LAP 5X35 DISP (ENDOMECHANICALS) ×3 IMPLANT
SET CHOLANGIOGRAPH MIX (MISCELLANEOUS) ×3 IMPLANT
SHEARS HARMONIC ACE PLUS 36CM (ENDOMECHANICALS) ×3 IMPLANT
SPONGE GAUZE 2X2 STER 10/PKG (GAUZE/BANDAGES/DRESSINGS) ×2
SUT MNCRL AB 4-0 PS2 18 (SUTURE) ×3 IMPLANT
SUT PDS AB 1 CT1 27 (SUTURE) ×6 IMPLANT
SYR 20CC LL (SYRINGE) ×3 IMPLANT
TOWEL OR 17X26 10 PK STRL BLUE (TOWEL DISPOSABLE) ×3 IMPLANT
TOWEL OR NON WOVEN STRL DISP B (DISPOSABLE) ×3 IMPLANT
TRAY LAPAROSCOPIC (CUSTOM PROCEDURE TRAY) ×3 IMPLANT
TROCAR BLADELESS OPT 5 100 (ENDOMECHANICALS) ×3 IMPLANT
TROCAR BLADELESS OPT 5 150 (ENDOMECHANICALS) ×3 IMPLANT
TUBING INSUF HEATED (TUBING) ×3 IMPLANT

## 2016-04-13 NOTE — Transfer of Care (Signed)
Immediate Anesthesia Transfer of Care Note  Patient: Joanna Cross  Procedure(s) Performed: Procedure(s): LAPAROSCOPIC CHOLECYSTECTOMY WITH SINGLE SITE with intraoperative cholangiogram (N/A)  Patient Location: PACU  Anesthesia Type:General  Level of Consciousness:  sedated, patient cooperative and responds to stimulation  Airway & Oxygen Therapy:Patient Spontanous Breathing and Patient connected to face mask oxgen  Post-op Assessment:  Report given to PACU RN and Post -op Vital signs reviewed and stable  Post vital signs:  Reviewed and stable  Last Vitals:  Vitals:   04/13/16 1354 04/13/16 1711  BP: (!) 139/92 (!) 147/80  Pulse: 74 75  Resp: 18 16  Temp:  36.9 C    Complications: No apparent anesthesia complications

## 2016-04-13 NOTE — ED Notes (Signed)
Report called to Juliette AlcideMelinda, [facility].

## 2016-04-13 NOTE — ED Notes (Signed)
MD at bedside discussing plan of care with patient at this time. 

## 2016-04-13 NOTE — Anesthesia Procedure Notes (Signed)
Procedure Name: Intubation Date/Time: 04/13/2016 7:34 PM Performed by: Early OsmondEARGLE, Madhav Mohon E Pre-anesthesia Checklist: Patient identified, Emergency Drugs available, Suction available and Patient being monitored Patient Re-evaluated:Patient Re-evaluated prior to inductionOxygen Delivery Method: Circle system utilized Preoxygenation: Pre-oxygenation with 100% oxygen Intubation Type: IV induction Ventilation: Mask ventilation without difficulty Laryngoscope Size: Miller and 3 Grade View: Grade I Tube type: Oral Number of attempts: 1 Airway Equipment and Method: Stylet Placement Confirmation: ETT inserted through vocal cords under direct vision,  positive ETCO2 and breath sounds checked- equal and bilateral Secured at: 21 cm Tube secured with: Tape Dental Injury: Teeth and Oropharynx as per pre-operative assessment

## 2016-04-13 NOTE — ED Notes (Signed)
[facility] here at this time 

## 2016-04-13 NOTE — Anesthesia Preprocedure Evaluation (Signed)
Anesthesia Evaluation  Patient identified by MRN, date of birth, ID band Patient awake    Reviewed: Allergy & Precautions, NPO status , Patient's Chart, lab work & pertinent test results  Airway Mallampati: II  TM Distance: >3 FB Neck ROM: Full    Dental no notable dental hx.    Pulmonary neg pulmonary ROS,    Pulmonary exam normal breath sounds clear to auscultation       Cardiovascular negative cardio ROS Normal cardiovascular exam Rhythm:Regular Rate:Normal     Neuro/Psych negative neurological ROS  negative psych ROS   GI/Hepatic negative GI ROS, Neg liver ROS,   Endo/Other  Morbid obesity  Renal/GU negative Renal ROS  negative genitourinary   Musculoskeletal negative musculoskeletal ROS (+)   Abdominal (+) + obese,   Peds negative pediatric ROS (+)  Hematology negative hematology ROS (+)   Anesthesia Other Findings   Reproductive/Obstetrics negative OB ROS                             Anesthesia Physical Anesthesia Plan  ASA: III  Anesthesia Plan: General   Post-op Pain Management:    Induction: Intravenous  Airway Management Planned: Oral ETT  Additional Equipment:   Intra-op Plan:   Post-operative Plan: Extubation in OR  Informed Consent: I have reviewed the patients History and Physical, chart, labs and discussed the procedure including the risks, benefits and alternatives for the proposed anesthesia with the patient or authorized representative who has indicated his/her understanding and acceptance.   Dental advisory given  Plan Discussed with: CRNA  Anesthesia Plan Comments:         Anesthesia Quick Evaluation

## 2016-04-13 NOTE — Interval H&P Note (Signed)
History and Physical Interval Note:  04/13/2016 6:09 PM  Joanna Cross  has presented today for surgery, with the diagnosis of CHOLELITHIASIS  The various methods of treatment have been discussed with the patient and family. After consideration of risks, benefits and other options for treatment, the patient has consented to  Procedure(s): LAPAROSCOPIC CHOLECYSTECTOMY WITH SINGLE SITE (N/A) as a surgical intervention .  The patient's history has been reviewed, patient examined, no change in status, stable for surgery.  I have reviewed the patient's chart and labs.  Questions were answered to the patient's satisfaction.     Navina Wohlers C.

## 2016-04-13 NOTE — ED Notes (Signed)
[facility] has left at this time. 

## 2016-04-13 NOTE — H&P (View-Only) (Signed)
Patient ID: Joanna Cross, female   DOB: 10/19/1975, 40 y.o.   MRN: [id number]  [facility] Progress Note     Subjective: Feeling better than when she arrived at the hospital. Some continued RUQ pain. Nausea has resolved. Last BM was yesterday. Last meal was yesterday.  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  Objective: Vital signs in last 24 hours: Temp:  [97.8 F (36.6 C)-98.8 F (37.1 C)] 98.6 F (37 C) (08/29 0556) Pulse Rate:  [73-97] 79 (08/29 0556) Resp:  [16-20] 20 (08/29 0556) BP: (119-145)/(66-100) 124/79 (08/29 0556) SpO2:  [99 %-100 %] 100 % (08/29 0556) Weight:  [291 lb (132 kg)] 291 lb (132 kg) (08/28 1955)    Intake/Output from previous day: 08/28 0701 - 08/29 0700 In: 50 [IV Piggyback:50] Out: 400 [Urine:400] Intake/Output this shift: No intake/output data recorded.  PE: Gen:  Alert, NAD, pleasant Card:  RRR, no M/G/R heard Pulm:  CTAB, no W/R/R Abd: Soft, ND, +BS, TTP RUQ   Lab Results:   Recent Labs  04/12/16 2340  WBC 12.4*  HGB 10.3*  HCT 32.4*  PLT 294   BMET  Recent Labs  04/12/16 2340  NA 135  K 3.1*  CL 104  CO2 24  GLUCOSE 89  BUN 14  CREATININE 0.77  CALCIUM 9.1   PT/INR No results for input(s): LABPROT, INR in the last 72 hours. CMP     Component Value Date/Time   NA 135 04/12/2016 2340   K 3.1 (L) 04/12/2016 2340   CL 104 04/12/2016 2340   CO2 24 04/12/2016 2340   GLUCOSE 89 04/12/2016 2340   BUN 14 04/12/2016 2340   CREATININE 0.77 04/12/2016 2340   CALCIUM 9.1 04/12/2016 2340   PROT 7.9 04/12/2016 2340   ALBUMIN 4.1 04/12/2016 2340   AST 14 (L) 04/12/2016 2340   ALT 12 (L) 04/12/2016 2340   ALKPHOS 84 04/12/2016 2340   BILITOT 0.1 (L) 04/12/2016 2340   GFRNONAA >60 04/12/2016 2340   GFRAA >60 04/12/2016 2340   Lipase     Component Value Date/Time   LIPASE 25 04/12/2016 2340       Studies/Results: Us Abdomen Complete  Result Date:  04/13/2016 CLINICAL DATA:  Right upper quadrant and epigastric pain for 1 week. EXAM: ABDOMEN ULTRASOUND COMPLETE COMPARISON:  None. FINDINGS: Gallbladder: The gallbladder is contracted around calculi. The patient was mildly tender to probe pressure over the gallbladder. There probably is a small volume pericholecystic fluid. No bile duct dilatation. Common bile duct: Diameter: 2 mm Liver: No focal liver lesions. There is mild diffusely increased hepatic echogenicity consistent with fatty infiltration. IVC: No abnormality visualized. Pancreas: Visualized portion unremarkable. Spleen: Size and appearance within normal limits. Right Kidney: Length: 11.2 cm. Echogenicity within normal limits. No mass or hydronephrosis visualized. Left Kidney: Length: 11.6 cm. Echogenicity within normal limits. No mass or hydronephrosis visualized. Abdominal aorta: No aneurysm visualized. Other findings: None. IMPRESSION: Cholelithiasis. The gallbladder is contracted around calculi, with mild tenderness over the gallbladder and with mild pericholecystic fluid. This could represent acute cholecystitis. Increased hepatic parenchymal echotexture may represent fatty infiltration. No bile duct dilatation. Electronically Signed   By: Daniel R Mitchell M.D.   On: 04/13/2016 00:27    Anti-infectives: Anti-infectives    Start     Dose/Rate Route Frequency Ordered Stop   04/13/16 0645  cefTRIAXone (ROCEPHIN) 2 g in dextrose 5 % 50 mL IVPB     2 g 100 mL/hr over 30   Minutes Intravenous Every 24 hours 04/13/16 0637         Assessment/Plan Acute cholecystitis with cholelithiasis - continue NPO - OR today or tomorrow - continue IV antibiotics (rocephin) Anxiety  FEN - NPO, IVF VTE - SCD's Plan - awaiting OR. Will add diet if surgery is not until tomorrow.   LOS: 1 day    BROOKE A MILLER , PA-C [facility] 04/13/2016, 7:51 AM Pager: [phone] Consults: [phone] Mon-Fri 7:00 am-4:30 pm Sat-Sun 7:00  am-11:30 am  

## 2016-04-13 NOTE — Discharge Instructions (Addendum)
LAPAROSCOPIC SURGERY: POST OP INSTRUCTIONS ° °###################################################################### ° °EAT °Gradually transition to a high fiber diet with a fiber supplement over the next few weeks after discharge.  Start with a pureed / full liquid diet (see below) ° °WALK °Walk an hour a day.  Control your pain to do that.   ° °CONTROL PAIN °Control pain so that you can walk, sleep, tolerate sneezing/coughing, go up/down stairs. ° °HAVE A BOWEL MOVEMENT DAILY °Keep your bowels regular to avoid problems.  OK to try a laxative to override constipation.  OK to use an antidairrheal to slow down diarrhea.  Call if not better after 2 tries ° °CALL IF YOU HAVE PROBLEMS/CONCERNS °Call if you are still struggling despite following these instructions. °Call if you have concerns not answered by these instructions ° °###################################################################### ° ° ° °1. DIET: Follow a light bland diet the first 24 hours after arrival home, such as soup, liquids, crackers, etc.  Be sure to include lots of fluids daily.  Avoid fast food or heavy meals as your are more likely to get nauseated.  Eat a low fat the next few days after surgery.   °2. Take your usually prescribed home medications unless otherwise directed. °3. PAIN CONTROL: °a. Pain is best controlled by a usual combination of three different methods TOGETHER: °i. Ice/Heat °ii. Over the counter pain medication °iii. Prescription pain medication °b. Most patients will experience some swelling and bruising around the incisions.  Ice packs or heating pads (30-60 minutes up to 6 times a day) will help. Use ice for the first few days to help decrease swelling and bruising, then switch to heat to help relax tight/sore spots and speed recovery.  Some people prefer to use ice alone, heat alone, alternating between ice & heat.  Experiment to what works for you.  Swelling and bruising can take several weeks to resolve.   °c. It is  helpful to take an over-the-counter pain medication regularly for the first few weeks.  Choose one of the following that works best for you: °i. Naproxen (Aleve, etc)  Two 220mg tabs twice a day °ii. Ibuprofen (Advil, etc) Three 200mg tabs four times a day (every meal & bedtime) °iii. Acetaminophen (Tylenol, etc) 500-650mg four times a day (every meal & bedtime) °d. A  prescription for pain medication (such as oxycodone, hydrocodone, etc) should be given to you upon discharge.  Take your pain medication as prescribed.  °i. If you are having problems/concerns with the prescription medicine (does not control pain, nausea, vomiting, rash, itching, etc), please call us [phone] to see if we need to switch you to a different pain medicine that will work better for you and/or control your side effect better. °ii. If you need a refill on your pain medication, please contact your pharmacy.  They will contact our office to request authorization. Prescriptions will not be filled after 5 pm or on week-ends. °4. Avoid getting constipated.  Between the surgery and the pain medications, it is common to experience some constipation.  Increasing fluid intake and taking a fiber supplement (such as Metamucil, Citrucel, FiberCon, MiraLax, etc) 1-2 times a day regularly will usually help prevent this problem from occurring.  A mild laxative (prune juice, Milk of Magnesia, MiraLax, etc) should be taken according to package directions if there are no bowel movements after 48 hours.   °5. Watch out for diarrhea.  If you have many loose bowel movements, simplify your diet to bland foods & liquids for   a few days.  Stop any stool softeners and decrease your fiber supplement.  Switching to mild anti-diarrheal medications (Kayopectate, Pepto Bismol) can help.  If this worsens or does not improve, please call us. °6. Wash / shower every day.  You may shower over the dressings as they are waterproof.  Continue to shower over incision(s)  after the dressing is off. °7. Remove your waterproof bandages 5 days after surgery.  You may leave the incision open to air.  You may replace a dressing/Band-Aid to cover the incision for comfort if you wish.  °8. ACTIVITIES as tolerated:   °a. You may resume regular (light) daily activities beginning the next day--such as daily self-care, walking, climbing stairs--gradually increasing activities as tolerated.  If you can walk 30 minutes without difficulty, it is safe to try more intense activity such as jogging, treadmill, bicycling, low-impact aerobics, swimming, etc. °b. Save the most intensive and strenuous activity for last such as sit-ups, heavy lifting, contact sports, etc  Refrain from any heavy lifting or straining until you are off narcotics for pain control.   °c. DO NOT PUSH THROUGH PAIN.  Let pain be your guide: If it hurts to do something, don't do it.  Pain is your body warning you to avoid that activity for another week until the pain goes down. °d. You may drive when you are no longer taking prescription pain medication, you can comfortably wear a seatbelt, and you can safely maneuver your car and apply brakes. °e. You may have sexual intercourse when it is comfortable.  °9. FOLLOW UP in our office °a. Please call [facility] at [phone] to set up an appointment to see your surgeon in the office for a follow-up appointment approximately 2-3 weeks after your surgery. °b. Make sure that you call for this appointment the day you arrive home to insure a convenient appointment time. °10. IF YOU HAVE DISABILITY OR FAMILY LEAVE FORMS, BRING THEM TO THE OFFICE FOR PROCESSING.  DO NOT GIVE THEM TO YOUR DOCTOR. ° ° °WHEN TO CALL US [phone]: °1. Poor pain control °2. Reactions / problems with new medications (rash/itching, nausea, etc)  °3. Fever over 101.5 F (38.5 C) °4. Inability to urinate °5. Nausea and/or vomiting °6. Worsening swelling or bruising °7. Continued bleeding from incision. °8. Increased  pain, redness, or drainage from the incision ° ° The clinic staff is available to answer your questions during regular business hours (8:30am-5pm).  Please don’t hesitate to call and ask to speak to one of our nurses for clinical concerns.  ° If you have a medical emergency, go to the nearest emergency room or call 911. ° A surgeon from [facility] is always on call at the hospitals ° ° °[facility] °[address], [address], [place], [place]  [address] ? °MAIN: [phone] ? TOLL FREE: [phone] ?  °FAX [phone] °[email or web address] ° ° ° ° °Cholecystitis °Cholecystitis is inflammation of the gallbladder. It is often called a gallbladder attack. The gallbladder is a pear-shaped organ that lies beneath the liver on the right side of the body. The gallbladder stores bile, which is a fluid that helps the body to digest fats. If bile builds up in your gallbladder, your gallbladder becomes inflamed. This condition may occur suddenly (be acute). Repeat episodes of acute cholecystitis or prolonged episodes may lead to a long-term (chronic) condition. Cholecystitis is serious and it requires treatment.  °CAUSES °The most common cause of this condition   is gallstones. Gallstones can block the tube (duct) that carries bile out of your gallbladder. This causes bile to build up. Other causes of this condition include: °· Damage to the gallbladder due to a decrease in blood flow. °· Infections in the bile ducts. °· Scars or kinks in the bile ducts. °· Tumors in the liver, pancreas, or gallbladder. °RISK FACTORS °This condition is more likely to develop in: °· People who have sickle cell disease. °· People who take birth control pills or use estrogen. °· People who have alcoholic liver disease. °· People who have liver cirrhosis. °· People who have their nutrition delivered through a vein (parenteral nutrition). °· People who do not eat or drink (do fasting) for a long  period of time. °· People who are obese. °· People who have rapid weight loss. °· People who are pregnant. °· People who have increased triglyceride levels. °· People who have pancreatitis. °SYMPTOMS °Symptoms of this condition include: °· Abdominal pain, especially in the upper right area of the abdomen. °· Abdominal tenderness or bloating. °· Nausea. °· Vomiting. °· Fever. °· Chills. °· Yellowing of the skin and the whites of the eyes (jaundice). °DIAGNOSIS °This condition is diagnosed with a medical history and physical exam. You may also have other tests, including: °· Imaging tests, such as: °¨ An ultrasound of the gallbladder. °¨ A CT scan of the abdomen. °¨ A gallbladder nuclear scan (HIDA scan). This scan allows your health care provider to see the bile moving from your liver to your gallbladder and to your small intestine. °¨ MRI. °· Blood tests, such as: °¨ A complete blood count, because the white blood cell count may be higher than normal. °¨ Liver function tests, because some levels may be higher than normal with certain types of gallstones. °TREATMENT °Treatment may include: °· Fasting for a certain amount of time. °· IV fluids. °· Medicine to treat pain or vomiting. °· Antibiotic medicine. °· Surgery to remove your gallbladder (cholecystectomy). This may happen immediately or at a later time. °HOME CARE INSTRUCTIONS °Home care will depend on your treatment. In general: °· Take over-the-counter and prescription medicines only as told by your health care provider. °· If you were prescribed an antibiotic medicine, take it as told by your health care provider. Do not stop taking the antibiotic even if you start to feel better. °· Follow instructions from your health care provider about what to eat or drink. When you are allowed to eat, avoid eating or drinking anything that triggers your symptoms. °· Keep all follow-up visits as told by your health care provider. This is important. °SEEK MEDICAL CARE  IF: °· Your pain is not controlled with medicine. °· You have a fever. °SEEK IMMEDIATE MEDICAL CARE IF: °· Your pain moves to another part of your abdomen or to your back. °· You continue to have symptoms or you develop new symptoms even with treatment. °  °This information is not intended to replace advice given to you by your health care provider. Make sure you discuss any questions you have with your health care provider. °  °Document Released: 08/02/2005 Document Revised: 04/23/2015 Document Reviewed: 11/13/2014 °Elsevier Interactive Patient Education ©2016 [employer] ° °

## 2016-04-13 NOTE — ED Notes (Signed)
General Surgery re-paged at 01:36.

## 2016-04-13 NOTE — Op Note (Signed)
04/13/2016  8:46 PM  PATIENT:  Joanna Cross  40 y.o. female  Patient Care Team: No Pcp Per Patient as PCP - General (General Practice)  PRE-OPERATIVE DIAGNOSIS:  CHOLELITHIASIS  POST-OPERATIVE DIAGNOSIS:  cholelithiasis  PROCEDURE:  Procedure(s): LAPAROSCOPIC CHOLECYSTECTOMY WITH SINGLE SITE with intraoperative cholangiogram  SURGEON:  Surgeon(s): Karie Soda, MD  ASSISTANT: RN   ANESTHESIA:   local and general  EBL:  Total I/O In: 1000 [I.V.:1000] Out: -   Delay start of Pharmacological VTE agent (>24hrs) due to surgical blood loss or risk of bleeding:  no  DRAINS: none   SPECIMEN:  Source of Specimen:  Gallbladder   DISPOSITION OF SPECIMEN:  PATHOLOGY  COUNTS:  YES  PLAN OF CARE: Admit for overnight observation  PATIENT DISPOSITION:  PACU - hemodynamically stable.  INDICATION: Patient with symptoms of biliary colic and now constant pain and leukocytosis suspicious for acute cholecystitis.  I recommended cholecystectomy:  The anatomy & physiology of hepatobiliary & pancreatic function was discussed.  The pathophysiology of gallbladder dysfunction was discussed.  Natural history risks without surgery was discussed.   I feel the risks of no intervention will lead to serious problems that outweigh the operative risks; therefore, I recommended cholecystectomy to remove the pathology.  I explained laparoscopic techniques with possible need for an open approach.  Probable cholangiogram to evaluate the bilary tract was explained as well.    Risks such as bleeding, infection, abscess, leak, injury to other organs, need for further treatment, heart attack, death, and other risks were discussed.  I noted a good likelihood this will help address the problem.  Possibility that this will not correct all abdominal symptoms was explained.  Goals of post-operative recovery were discussed as well.  We will work to minimize complications.  An educational handout further explaining the  pathology and treatment options was given as well.  Questions were answered.  The patient expresses understanding & wishes to proceed with surgery.   OR FINDINGS: Gallbladder wall thickening with some edema and adhesions suspicious for acute on chronic cholecystitis.  Gallbladder very full of small numerous stones.  Narrow cystic duct.  Clean to [place] with classic biliary anatomy without any choledocholithiasis or other abnormalities.  DESCRIPTION:   The patient was identified & brought in the operating room. The patient was positioned supine with arms tucked. SCDs were active during the entire case. The patient underwent general anesthesia without any difficulty.  The abdomen was prepped and draped in a sterile fashion. A Surgical Timeout confirmed our plan.  I made a transverse curvilinear incision through the superior umbilical fold.  I placed a 5mm long port through the supraumbilical fascia using a modified Hassan cutdown technique. I began carbon dioxide insufflation. Camera inspection revealed no injury. There were no adhesions to the anterior abdominal wall supraumbilically.  I proceeded to continue with single site technique. I placed a #5 port in left upper aspect of the wound. I placed a 5 mm atraumatic grasper in the right inferior aspect of the wound.  I turned attention to the right upper quadrant.  The gallbladder fundus was elevated cephalad.  Freed some adhesions of greater omentum and mesocolon and duodenal bulb off the gallbladder to expose the infundibulum and ventral surface of the gallbladder.  I freed the peritoneal coverings between the gallbladder and the liver on the posteriolateral and anteriomedial walls. I alternated between Harmonic & blunt Maryland dissection to help get a good critical view of the cystic artery and cystic duct. I did  further dissection to free a few centimeters of the  gallbladder off the liver bed to get a good critical view of the infundibulum and  cystic duct. I mobilized the cystic artery; and, after getting a good 360 view, ligated the cystic artery using the Harmonic ultrasonic dissection. I skeletonized the cystic duct.  I placed a clip on the infundibulum. I did a partial cystic duct-otomy and ensured patency. I placed a 5 [place] cholangiocatheter through a puncture site at the right subcostal ridge of the abdominal wall and directed it into the cystic duct.  We ran a cholangiogram with dilute radio-opaque contrast and continuous fluoroscopy.  Contrast flowed from a side branch consistent with cystic duct cannulization. Contrast flowed up the common hepatic duct into the right and left intrahepatic chains out to secondary radicals. Contrast flowed down the common bile duct easily across the normal ampulla into the duodenum.  This was consistent with a normal cholangiogram.  I removed the cholangiocatheter. I placed clips on the cystic duct x4.  I completed cystic duct transection. I freed the gallbladder from its remaining attachments to the liver. I ensured hemostasis on the gallbladder fossa of the liver and elsewhere. I inspected the rest of the abdomen & detected no injury nor bleeding elsewhere.  I removed the gallbladder out the supraumbilical fascia. I closed the fascia transversely using #1 PDS interrupted stitches. I closed the skin using 4-0 monocryl stitch.  Sterile dressing was applied. The patient was extubated & arrived in the PACU in stable condition..  I had discussed postoperative care with the patient in the holding area.  I am about to locate the patient's family and discuss operative findings and postoperative goals / instructions.  Instructions are written in the chart as well.  Ardeth SportsmanSteven C. Maitland Muhlbauer, M.D., F.A.C.S. Gastrointestinal and Minimally Invasive Surgery [facility] [address] [place], [place] [id number] [phone] Main / Paging

## 2016-04-13 NOTE — H&P (Signed)
Joanna Cross is an 40 y.o. female.    General Surgery [facility]  Chief Complaint: abdominal pain, acute cholecystitis, cholelithiasis  HPI: Patient is a 40 year old female who presents with a one-week history of epigastric and right upper quadrant abdominal pain. Pain became more severe on the day prior to admission and was associated with nausea. Pain radiates to the back. Patient presented at [facility] [place]. She was noted to have a mildly elevated white blood cell count with a slight left shift. Ultrasound demonstrated a contracted gallbladder containing gallstones with pericholecystic fluid and sonographic Murphy sign consistent with acute cholecystitis. General surgery was contacted and the patient was transferred to [facility] for surgical management.  Family history of gallbladder disease in multiple family members.  Previous abdominal surgery includes 2 cesarean sections and a tubal ligation.  Past Medical History:  Diagnosis Date  . Anxiety     Past Surgical History:  Procedure Laterality Date  . CESAREAN SECTION    . TUBAL LIGATION      History reviewed. No pertinent family history. Social History:  reports that she has never smoked. She has never used smokeless tobacco. She reports that she does not drink alcohol or use drugs.  Allergies:  Allergies  Allergen Reactions  . Codeine Hives    Medications Prior to Admission  Medication Sig Dispense Refill  . sertraline (ZOLOFT) 25 MG tablet Take 25 mg by mouth daily.      Results for orders placed or performed during the hospital encounter of 04/12/16 (from the past 48 hour(s))  Urinalysis, Routine w reflex microscopic (not at [facility])     Status: Abnormal   Collection Time: 04/12/16  9:50 PM  Result Value Ref Range   Color, Urine YELLOW YELLOW   APPearance CLOUDY (A) CLEAR   Specific Gravity, Urine 1.024 1.005 - 1.030   pH 6.0 5.0 - 8.0   Glucose, UA NEGATIVE NEGATIVE mg/dL   Hgb urine dipstick TRACE (A) NEGATIVE   Bilirubin Urine NEGATIVE NEGATIVE   Ketones, ur NEGATIVE NEGATIVE mg/dL   Protein, ur NEGATIVE NEGATIVE mg/dL   Nitrite NEGATIVE NEGATIVE   Leukocytes, UA LARGE (A) NEGATIVE  Pregnancy, urine     Status: None   Collection Time: 04/12/16  9:50 PM  Result Value Ref Range   Preg Test, Ur NEGATIVE NEGATIVE    Comment:        THE SENSITIVITY OF THIS METHODOLOGY IS >20 mIU/mL.   Urine microscopic-add on     Status: Abnormal   Collection Time: 04/12/16  9:50 PM  Result Value Ref Range   Squamous Epithelial / LPF 6-30 (A) NONE SEEN   WBC, UA 6-30 0 - 5 WBC/hpf   RBC / HPF 0-5 0 - 5 RBC/hpf   Bacteria, UA MANY (A) NONE SEEN  CBC with Differential/Platelet     Status: Abnormal   Collection Time: 04/12/16 11:40 PM  Result Value Ref Range   WBC 12.4 (H) 4.0 - 10.5 K/uL   RBC 4.85 3.87 - 5.11 MIL/uL   Hemoglobin 10.3 (L) 12.0 - 15.0 g/dL   HCT 32.4 (L) 36.0 - 46.0 %   MCV 66.8 (L) 78.0 - 100.0 fL   MCH 21.2 (L) 26.0 - 34.0 pg   MCHC 31.8 30.0 - 36.0 g/dL   RDW 17.8 (H) 11.5 - 15.5 %   Platelets 294 150 - 400 K/uL   Neutrophils Relative % 68 %   Neutro Abs 8.4 (H) 1.7 - 7.7  K/uL   Lymphocytes Relative 24 %   Lymphs Abs 3.0 0.7 - 4.0 K/uL   Monocytes Relative 5 %   Monocytes Absolute 0.7 0.1 - 1.0 K/uL   Eosinophils Relative 2 %   Eosinophils Absolute 0.2 0.0 - 0.7 K/uL   Basophils Relative 0 %   Basophils Absolute 0.0 0.0 - 0.1 K/uL   WBC Morphology WHITE COUNT CONFIRMED ON SMEAR    RBC Morphology ELLIPTOCYTES    Smear Review PLATELET COUNT CONFIRMED BY SMEAR   Comprehensive metabolic panel     Status: Abnormal   Collection Time: 04/12/16 11:40 PM  Result Value Ref Range   Sodium 135 135 - 145 mmol/L   Potassium 3.1 (L) 3.5 - 5.1 mmol/L   Chloride 104 101 - 111 mmol/L   CO2 24 22 - 32 mmol/L   Glucose, Bld 89 65 - 99 mg/dL   BUN 14 6 - 20 mg/dL   Creatinine, Ser 0.77 0.44 - 1.00 mg/dL   Calcium 9.1 8.9 - 10.3 mg/dL   Total Protein 7.9  6.5 - 8.1 g/dL   Albumin 4.1 3.5 - 5.0 g/dL   AST 14 (L) 15 - 41 U/L   ALT 12 (L) 14 - 54 U/L   Alkaline Phosphatase 84 38 - 126 U/L   Total Bilirubin 0.1 (L) 0.3 - 1.2 mg/dL   GFR calc non Af Amer >60 >60 mL/min   GFR calc Af Amer >60 >60 mL/min    Comment: (NOTE) The eGFR has been calculated using the CKD EPI equation. This calculation has not been validated in all clinical situations. eGFR's persistently <60 mL/min signify possible Chronic Kidney Disease.    Anion gap 7 5 - 15  Lipase, blood     Status: None   Collection Time: 04/12/16 11:40 PM  Result Value Ref Range   Lipase 25 11 - 51 U/L  Surgical PCR screen     Status: None   Collection Time: 04/13/16  3:55 AM  Result Value Ref Range   MRSA, PCR NEGATIVE NEGATIVE   Staphylococcus aureus NEGATIVE NEGATIVE    Comment:        The Xpert SA Assay (FDA approved for NASAL specimens in patients over 5 years of age), is one component of a comprehensive surveillance program.  Test performance has been validated by [facility] for patients greater than or equal to 83 year old. It is not intended to diagnose infection nor to guide or monitor treatment.    US Abdomen Complete  Result Date: 04/13/2016 CLINICAL DATA:  Right upper quadrant and epigastric pain for 1 week. EXAM: ABDOMEN ULTRASOUND COMPLETE COMPARISON:  None. FINDINGS: Gallbladder: The gallbladder is contracted around calculi. The patient was mildly tender to probe pressure over the gallbladder. There probably is a small volume pericholecystic fluid. No bile duct dilatation. Common bile duct: Diameter: 2 mm Liver: No focal liver lesions. There is mild diffusely increased hepatic echogenicity consistent with fatty infiltration. IVC: No abnormality visualized. Pancreas: Visualized portion unremarkable. Spleen: Size and appearance within normal limits. Right Kidney: Length: 11.2 cm. Echogenicity within normal limits. No mass or hydronephrosis visualized. Left Kidney:  Length: 11.6 cm. Echogenicity within normal limits. No mass or hydronephrosis visualized. Abdominal aorta: No aneurysm visualized. Other findings: None. IMPRESSION: Cholelithiasis. The gallbladder is contracted around calculi, with mild tenderness over the gallbladder and with mild pericholecystic fluid. This could represent acute cholecystitis. Increased hepatic parenchymal echotexture may represent fatty infiltration. No bile duct dilatation. Electronically Signed   By: Quillian Quince  Armandina Stammer M.D.   On: 04/13/2016 00:27    Review of Systems  Constitutional: Negative for chills, diaphoresis and fever.  HENT: Negative.   Eyes: Negative.   Respiratory: Negative.   Cardiovascular: Negative.   Gastrointestinal: Positive for abdominal pain (epigastric & RUQ) and nausea. Negative for constipation, diarrhea and vomiting.  Genitourinary: Negative.   Musculoskeletal: Positive for back pain.  Skin: Negative.   Neurological: Negative.  Negative for weakness.  Endo/Heme/Allergies: Negative.   Psychiatric/Behavioral: Negative.     Blood pressure 124/79, pulse 79, temperature 98.6 F (37 C), temperature source Oral, resp. rate 20, height 5' 7.5" (1.715 m), weight 132 kg (291 lb), last menstrual period 04/03/2016, SpO2 100 %. Physical Exam  Constitutional: She is oriented to person, place, and time. She appears well-developed and well-nourished. No distress.  HENT:  Head: Normocephalic and atraumatic.  Right Ear: External ear normal.  Left Ear: External ear normal.  Eyes: Conjunctivae are normal. Pupils are equal, round, and reactive to light. No scleral icterus.  Neck: Normal range of motion. Neck supple. No tracheal deviation present. No thyromegaly present.  Cardiovascular: Normal rate, regular rhythm and normal heart sounds.   No murmur heard. Respiratory: Effort normal and breath sounds normal. No respiratory distress. She has no wheezes.  GI: Soft. Bowel sounds are normal. She exhibits no  distension and no mass. There is tenderness (RUQ). There is no rebound and no guarding.  Musculoskeletal: Normal range of motion. She exhibits no edema, tenderness or deformity.  Neurological: She is alert and oriented to person, place, and time.  Skin: Skin is warm and dry. She is not diaphoretic.  Psychiatric: She has a normal mood and affect. Her behavior is normal.     Assessment/Plan Acute cholecystitis, cholelithiasis  Admit to general surgery service  Initiate IV abx's  Rx pain, nausea prn  IV hydration  NPO  Anticipate cholecystectomy this admission  Earnstine Regal, MD, [facility] Office: [place], MD 04/13/2016, 6:30 AM

## 2016-04-13 NOTE — Progress Notes (Signed)
Patient ID: Joanna Cross, female   DOB: 07-13-1976, 40 y.o.   MRN: [id number]  [facility] Surgery Progress Note     Subjective: Feeling better than when she arrived at the hospital. Some continued RUQ pain. Nausea has resolved. Last BM was yesterday. Last meal was yesterday.  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  Objective: Vital signs in last 24 hours: Temp:  [97.8 F (36.6 C)-98.8 F (37.1 C)] 98.6 F (37 C) (08/29 0556) Pulse Rate:  [73-97] 79 (08/29 0556) Resp:  [16-20] 20 (08/29 0556) BP: (119-145)/(66-100) 124/79 (08/29 0556) SpO2:  [99 %-100 %] 100 % (08/29 0556) Weight:  [291 lb (132 kg)] 291 lb (132 kg) (08/28 1955)    Intake/Output from previous day: 08/28 0701 - 08/29 0700 In: 50 [IV Piggyback:50] Out: 400 [Urine:400] Intake/Output this shift: No intake/output data recorded.  PE: Gen:  Alert, NAD, pleasant Card:  RRR, no M/G/R heard Pulm:  CTAB, no W/R/R Abd: Soft, ND, +BS, TTP RUQ   Lab Results:   Recent Labs  04/12/16 2340  WBC 12.4*  HGB 10.3*  HCT 32.4*  PLT 294   BMET  Recent Labs  04/12/16 2340  NA 135  K 3.1*  CL 104  CO2 24  GLUCOSE 89  BUN 14  CREATININE 0.77  CALCIUM 9.1   PT/INR No results for input(s): LABPROT, INR in the last 72 hours. CMP     Component Value Date/Time   NA 135 04/12/2016 2340   K 3.1 (L) 04/12/2016 2340   CL 104 04/12/2016 2340   CO2 24 04/12/2016 2340   GLUCOSE 89 04/12/2016 2340   BUN 14 04/12/2016 2340   CREATININE 0.77 04/12/2016 2340   CALCIUM 9.1 04/12/2016 2340   PROT 7.9 04/12/2016 2340   ALBUMIN 4.1 04/12/2016 2340   AST 14 (L) 04/12/2016 2340   ALT 12 (L) 04/12/2016 2340   ALKPHOS 84 04/12/2016 2340   BILITOT 0.1 (L) 04/12/2016 2340   GFRNONAA >60 04/12/2016 2340   GFRAA >60 04/12/2016 2340   Lipase     Component Value Date/Time   LIPASE 25 04/12/2016 2340       Studies/Results: US Abdomen Complete  Result Date:  04/13/2016 CLINICAL DATA:  Right upper quadrant and epigastric pain for 1 week. EXAM: ABDOMEN ULTRASOUND COMPLETE COMPARISON:  None. FINDINGS: Gallbladder: The gallbladder is contracted around calculi. The patient was mildly tender to probe pressure over the gallbladder. There probably is a small volume pericholecystic fluid. No bile duct dilatation. Common bile duct: Diameter: 2 mm Liver: No focal liver lesions. There is mild diffusely increased hepatic echogenicity consistent with fatty infiltration. IVC: No abnormality visualized. Pancreas: Visualized portion unremarkable. Spleen: Size and appearance within normal limits. Right Kidney: Length: 11.2 cm. Echogenicity within normal limits. No mass or hydronephrosis visualized. Left Kidney: Length: 11.6 cm. Echogenicity within normal limits. No mass or hydronephrosis visualized. Abdominal aorta: No aneurysm visualized. Other findings: None. IMPRESSION: Cholelithiasis. The gallbladder is contracted around calculi, with mild tenderness over the gallbladder and with mild pericholecystic fluid. This could represent acute cholecystitis. Increased hepatic parenchymal echotexture may represent fatty infiltration. No bile duct dilatation. Electronically Signed   By: Ellery Plunk M.D.   On: 04/13/2016 00:27    Anti-infectives: Anti-infectives    Start     Dose/Rate Route Frequency Ordered Stop   04/13/16 0645  cefTRIAXone (ROCEPHIN) 2 g in dextrose 5 % 50 mL IVPB     2 g 100 mL/hr over 30  Minutes Intravenous Every 24 hours 04/13/16 [id number]         Assessment/Plan Acute cholecystitis with cholelithiasis - continue NPO - OR today or tomorrow - continue IV antibiotics (rocephin) Anxiety  FEN - NPO, IVF VTE - SCD's Plan - awaiting OR. Will add diet if surgery is not until tomorrow.   LOS: 1 day    Edson SnowballBROOKE A MILLER , [facility] 04/13/2016, 7:51 AM Pager: [phone] Consults: [phone] Mon-Fri 7:00 am-4:30 pm Sat-Sun 7:00  am-11:30 am

## 2016-04-14 ENCOUNTER — Encounter (HOSPITAL_COMMUNITY): Payer: Self-pay | Admitting: Surgery

## 2016-04-14 ENCOUNTER — Encounter (INDEPENDENT_AMBULATORY_CARE_PROVIDER_SITE_OTHER): Payer: Self-pay | Admitting: Physician Assistant

## 2016-04-14 MED ORDER — SODIUM CHLORIDE 0.9% FLUSH: 3.0000 mL | Freq: Two times a day (BID) | INTRAVENOUS | Status: DC

## 2016-04-14 MED ORDER — TRAMADOL HCL 50 MG PO TABS: 50.0000 mg | ORAL_TABLET | Freq: Four times a day (QID) | ORAL | 0 refills | Status: DC | PRN

## 2016-04-14 MED ORDER — SODIUM CHLORIDE 0.9% FLUSH: 3.0000 mL | INTRAVENOUS | Status: DC | PRN

## 2016-04-14 MED ORDER — LACTATED RINGERS IV BOLUS (SEPSIS): 1000.0000 mL | Freq: Three times a day (TID) | INTRAVENOUS | Status: DC | PRN

## 2016-04-14 MED ORDER — ENOXAPARIN SODIUM 30 MG/0.3ML ~~LOC~~ SOLN
30.0000 mg | SUBCUTANEOUS | Status: DC
  Administered 2016-04-14: 30 mg via SUBCUTANEOUS
  Filled 2016-04-14: qty 0.3

## 2016-04-14 MED ORDER — SODIUM CHLORIDE 0.9 % IV SOLN: 250.0000 mL | INTRAVENOUS | Status: DC | PRN

## 2016-04-14 NOTE — Progress Notes (Signed)
Patient ID: Joanna Cross, female   DOB: Oct 14, 1975, 40 y.o.   MRN: [id number]  [facility]  1 Day Post-Op  Subjective: Doing well this morning. Tolerating diet, pain well controlled, has been up and walking. No BM but has been passing flatus.  Objective: Vital signs in last 24 hours: Temp:  [97.9 F (36.6 C)-98.5 F (36.9 C)] 98.4 F (36.9 C) (08/30 40980633) Pulse Rate:  [69-95] 88 (08/30 0633) Resp:  [14-23] 16 (08/30 11910633) BP: (122-147)/(65-96) 131/85 (08/30 0633) SpO2:  [94 %-100 %] 100 % (08/30 47820633) Last BM Date: 04/11/16  Intake/Output from previous day: 08/29 0701 - 08/30 0700 In: 2861.7 [P.O.:240; I.V.:2521.7; IV Piggyback:100] Out: 2110 [Urine:2100; Blood:10] Intake/Output this shift: No intake/output data recorded.  PE: Gen:  Alert, NAD, pleasant Card:  RRR Pulm:  CTAB Abd: Soft, ND, minimal appropriate tenderness, +BS, incision covered with clean/dry dressing  Lab Results:   Recent Labs  04/12/16 2340  WBC 12.4*  HGB 10.3*  HCT 32.4*  PLT 294   BMET  Recent Labs  04/12/16 2340  NA 135  K 3.1*  CL 104  CO2 24  GLUCOSE 89  BUN 14  CREATININE 0.77  CALCIUM 9.1   PT/INR No results for input(s): LABPROT, INR in the last 72 hours. CMP     Component Value Date/Time   NA 135 04/12/2016 2340   K 3.1 (L) 04/12/2016 2340   CL 104 04/12/2016 2340   CO2 24 04/12/2016 2340   GLUCOSE 89 04/12/2016 2340   BUN 14 04/12/2016 2340   CREATININE 0.77 04/12/2016 2340   CALCIUM 9.1 04/12/2016 2340   PROT 7.9 04/12/2016 2340   ALBUMIN 4.1 04/12/2016 2340   AST 14 (L) 04/12/2016 2340   ALT 12 (L) 04/12/2016 2340   ALKPHOS 84 04/12/2016 2340   BILITOT 0.1 (L) 04/12/2016 2340   GFRNONAA >60 04/12/2016 2340   GFRAA >60 04/12/2016 2340   Lipase     Component Value Date/Time   LIPASE 25 04/12/2016 2340       Studies/Results: Dg Cholangiogram Operative  Result Date: 04/13/2016 CLINICAL DATA:  Intraoperative cholangiogram.  History of cholelithiasis with inflammatory changes ultrasound. EXAM: INTRAOPERATIVE CHOLANGIOGRAM TECHNIQUE: Cholangiographic images from the C-arm fluoroscopic device were submitted for interpretation post-operatively. Please see the procedural report for the amount of contrast and the fluoroscopy time utilized. COMPARISON:  Ultrasound right upper quadrant 04/12/2016 FINDINGS: Intraoperative fluoroscopy is utilized with images obtained during contrast injection into the cystic duct. Fluoroscopy time is recorded at 17 seconds. Dose is not recorded. Two cine fluoroscopic image sets were obtained and a single spot fluoroscopic image of the right upper quadrant was obtained. Surgical clips across cystic duct. A catheter cannulates the cystic duct. Contrast injection demonstrates free flow of contrast material throughout the cystic duct, extrahepatic bile ducts, and with free flow to the duodenum. Intrahepatic bile ducts are skeletally out behind this well. No evidence of intra or extrahepatic bile duct dilatation. No focal filling defects demonstrated. No contrast extravasation. IMPRESSION: No evidence of biliary obstruction. Electronically Signed   By: Burman NievesWilliam  Stevens M.D.   On: 04/13/2016 22:25   [place] Abdomen Complete  Result Date: 04/13/2016 CLINICAL DATA:  Right upper quadrant and epigastric pain for 1 week. EXAM: ABDOMEN ULTRASOUND COMPLETE COMPARISON:  None. FINDINGS: Gallbladder: The gallbladder is contracted around calculi. The patient was mildly tender to probe pressure over the gallbladder. There probably is a small volume pericholecystic fluid. No bile duct dilatation. Common bile duct: Diameter:  2 mm Liver: No focal liver lesions. There is mild diffusely increased hepatic echogenicity consistent with fatty infiltration. IVC: No abnormality visualized. Pancreas: Visualized portion unremarkable. Spleen: Size and appearance within normal limits. Right Kidney: Length: 11.2 cm. Echogenicity within normal  limits. No mass or hydronephrosis visualized. Left Kidney: Length: 11.6 cm. Echogenicity within normal limits. No mass or hydronephrosis visualized. Abdominal aorta: No aneurysm visualized. Other findings: None. IMPRESSION: Cholelithiasis. The gallbladder is contracted around calculi, with mild tenderness over the gallbladder and with mild pericholecystic fluid. This could represent acute cholecystitis. Increased hepatic parenchymal echotexture may represent fatty infiltration. No bile duct dilatation. Electronically Signed   By: Ellery Plunk M.D.   On: 04/13/2016 00:27    Anti-infectives: Anti-infectives    Start     Dose/Rate Route Frequency Ordered Stop   04/13/16 0645  cefTRIAXone (ROCEPHIN) 2 g in dextrose 5 % 50 mL IVPB     2 g 100 mL/hr over 30 Minutes Intravenous Every 24 hours 04/13/16 4098         Assessment/Plan S/p LAPAROSCOPIC CHOLECYSTECTOMY WITH SINGLE SITE with intraoperative cholangiogram 04/13/16 Dr. Michaell Cowing - POD1 Anxiety  FEN - advance diet as tolerated VTE - SCD's, lovenox ID - day 2 rocephin (received today's dose this morning) Plan - tolerating diet, passing flatus, ambulating well. Will ensure that pain is well controlled on PO medication, then ready for discharge. No need for PO antibiotics at home. Follow-up scheduled in 2 weeks.   LOS: 1 day    Edson Snowball , [facility] 04/14/2016, 8:03 AM Pager: [phone] Consults: [phone] Mon-Fri 7:00 am-4:30 pm Sat-Sun 7:00 am-11:30 am

## 2016-04-14 NOTE — Anesthesia Postprocedure Evaluation (Signed)
Anesthesia Post Note  Patient: Joanna Cross  Procedure(s) Performed: Procedure(s) (LRB): LAPAROSCOPIC CHOLECYSTECTOMY WITH SINGLE SITE with intraoperative cholangiogram (N/A)  Patient location during evaluation: PACU Anesthesia Type: General Level of consciousness: awake and alert Pain management: pain level controlled Vital Signs Assessment: post-procedure vital signs reviewed and stable Respiratory status: spontaneous breathing, nonlabored ventilation, respiratory function stable and patient connected to nasal cannula oxygen Cardiovascular status: blood pressure returned to baseline and stable Postop Assessment: no signs of nausea or vomiting Anesthetic complications: no    Last Vitals:  Vitals:   04/14/16 0144 04/14/16 0633  BP: 128/79 131/85  Pulse: 91 88  Resp: 14 16  Temp: 36.9 C 36.9 C    Last Pain:  Vitals:   04/14/16 0633  TempSrc: Oral  PainSc:                  [name] J

## 2016-04-14 NOTE — Discharge Summary (Signed)
[facility] Discharge Summary   Patient ID: Joanna BachCherie Cross MRN: [id number] DOB/AGE: October 20, 1975 40 y.o.  Admit date: 04/12/2016 Discharge date: 04/14/2016  Admitting Diagnosis: Acute cholecystitis with cholelithiasis  Discharge Diagnosis Patient Active Problem List   Diagnosis Date Noted  . Acute cholecystitis s/p lap cholecystectomy 04/13/2016 04/13/2016    Consultants None  Imaging: Dg Cholangiogram Operative  Result Date: 04/13/2016 CLINICAL DATA:  Intraoperative cholangiogram. History of cholelithiasis with inflammatory changes ultrasound. EXAM: INTRAOPERATIVE CHOLANGIOGRAM TECHNIQUE: Cholangiographic images from the C-arm fluoroscopic device were submitted for interpretation post-operatively. Please see the procedural report for the amount of contrast and the fluoroscopy time utilized. COMPARISON:  Ultrasound right upper quadrant 04/12/2016 FINDINGS: Intraoperative fluoroscopy is utilized with images obtained during contrast injection into the cystic duct. Fluoroscopy time is recorded at 17 seconds. Dose is not recorded. Two cine fluoroscopic image sets were obtained and a single spot fluoroscopic image of the right upper quadrant was obtained. Surgical clips across cystic duct. A catheter cannulates the cystic duct. Contrast injection demonstrates free flow of contrast material throughout the cystic duct, extrahepatic bile ducts, and with free flow to the duodenum. Intrahepatic bile ducts are skeletally out behind this well. No evidence of intra or extrahepatic bile duct dilatation. No focal filling defects demonstrated. No contrast extravasation. IMPRESSION: No evidence of biliary obstruction. Electronically Signed   By: Burman NievesWilliam  Stevens M.D.   On: 04/13/2016 22:25   [place] Abdomen Complete  Result Date: 04/13/2016 CLINICAL DATA:  Right upper quadrant and epigastric pain for 1 week. EXAM: ABDOMEN ULTRASOUND COMPLETE COMPARISON:  None. FINDINGS: Gallbladder: The gallbladder is  contracted around calculi. The patient was mildly tender to probe pressure over the gallbladder. There probably is a small volume pericholecystic fluid. No bile duct dilatation. Common bile duct: Diameter: 2 mm Liver: No focal liver lesions. There is mild diffusely increased hepatic echogenicity consistent with fatty infiltration. IVC: No abnormality visualized. Pancreas: Visualized portion unremarkable. Spleen: Size and appearance within normal limits. Right Kidney: Length: 11.2 cm. Echogenicity within normal limits. No mass or hydronephrosis visualized. Left Kidney: Length: 11.6 cm. Echogenicity within normal limits. No mass or hydronephrosis visualized. Abdominal aorta: No aneurysm visualized. Other findings: None. IMPRESSION: Cholelithiasis. The gallbladder is contracted around calculi, with mild tenderness over the gallbladder and with mild pericholecystic fluid. This could represent acute cholecystitis. Increased hepatic parenchymal echotexture may represent fatty infiltration. No bile duct dilatation. Electronically Signed   By: Ellery Plunkaniel R Mitchell M.D.   On: 04/13/2016 00:27    Procedures Dr. Michaell CowingGross (04/13/16) - [facility]aroscopic [facility]cyst[facility]  Hospital Course:  Joanna BachCherie Cross is a 40yo female [facility] with 1 week history of worsening epigastric and RUQ pain.  Workup showed acute cholecystitis with cholelithiasis.  Patient was admitted, started on IV antibiotics (rocephin), and underwent procedure listed above.  Tolerated procedure well and was transferred to the floor.  On POD1 the patient was tolerating diet, ambulating well, pain well controlled, vital signs stable, incisions c/d/i and felt stable for discharge home.  Patient will follow up in our office in 2 weeks and knows to call with questions or concerns.   Physical Exam: Gen:  Alert, NAD, pleasant Card:  RRR Pulm:  CTAB Abd: Soft, ND, minimal appropriate tenderness, +BS, incision covered with clean/dry dressing     Medication List    TAKE these medications   sertraline 25 MG tablet Commonly known as:  ZOLOFT Take 25 mg by mouth daily.   traMADol 50 MG tablet Commonly known as:  ULTRAM Take 1-2 tablets (50-100 mg total) by mouth every 6 (six) hours as needed for moderate pain or severe pain.        Follow-up Information    [facility] [place] .   Specialty:  General Surgery Why:  04/27/16 at 2:15pm. Please arrive 30 minutes prior to your appointment to fill out necessary paperwork. If you do not arrive at 1:45pm we may have to reschedule you. Contact information: [address] [place] [address] [phone]          Signed: Edson Snowball, [facility] 04/14/2016, 3:26 PM Pager: [phone] Consults: [phone] Mon-Fri 7:00 am-4:30 pm Sat-Sun 7:00 am-11:30 am

## 2016-09-04 IMAGING — CR DG CHEST 2V
2 series · 2 of 2 positions shown · non-contrast
Comparison: Chest x-ray 06/10/2015.

CLINICAL DATA: 39-year-old female with history of cough and
congestion for 2 days. No history of fever.

EXAM:
CHEST  2 VIEW

[w chest pa]
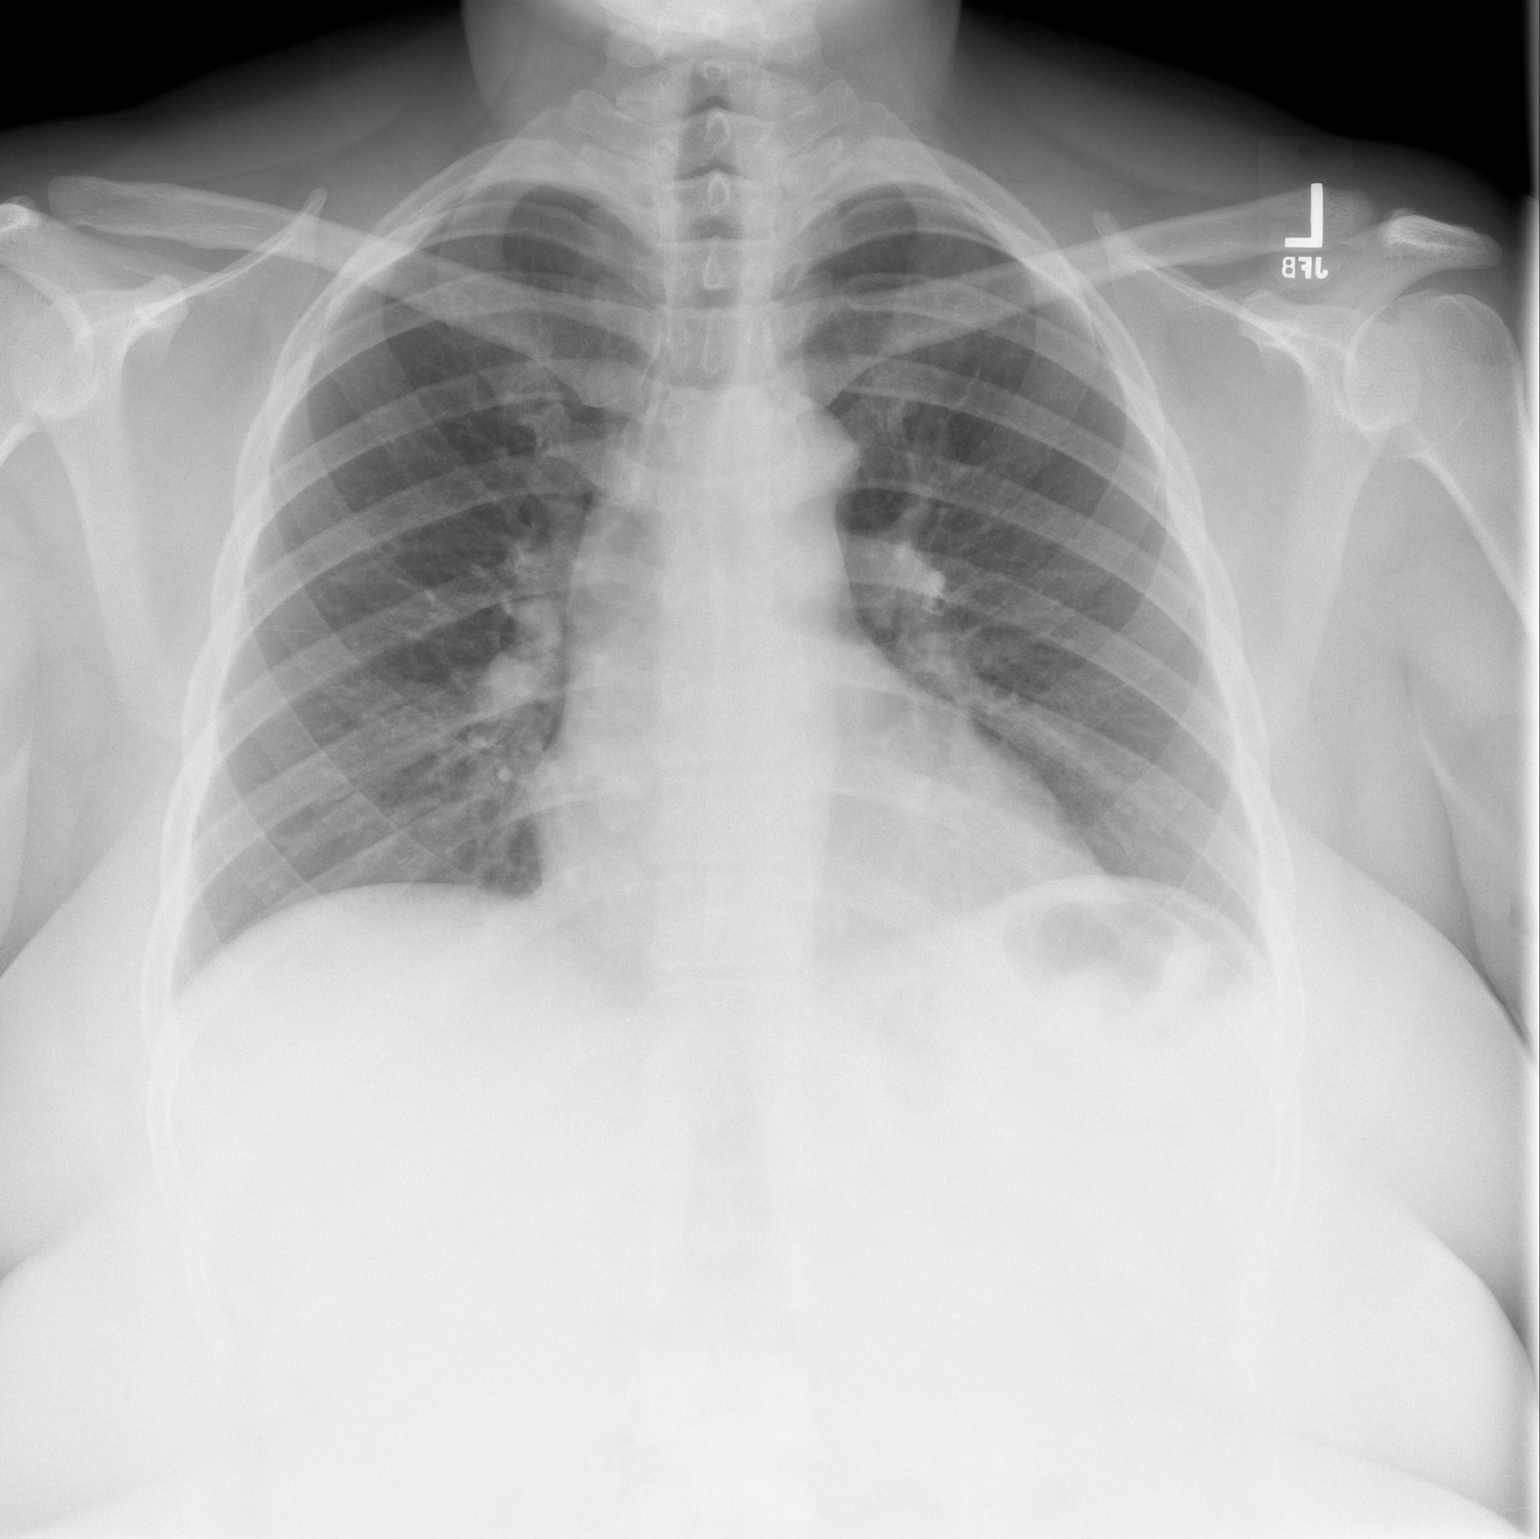

[w chest lat *]
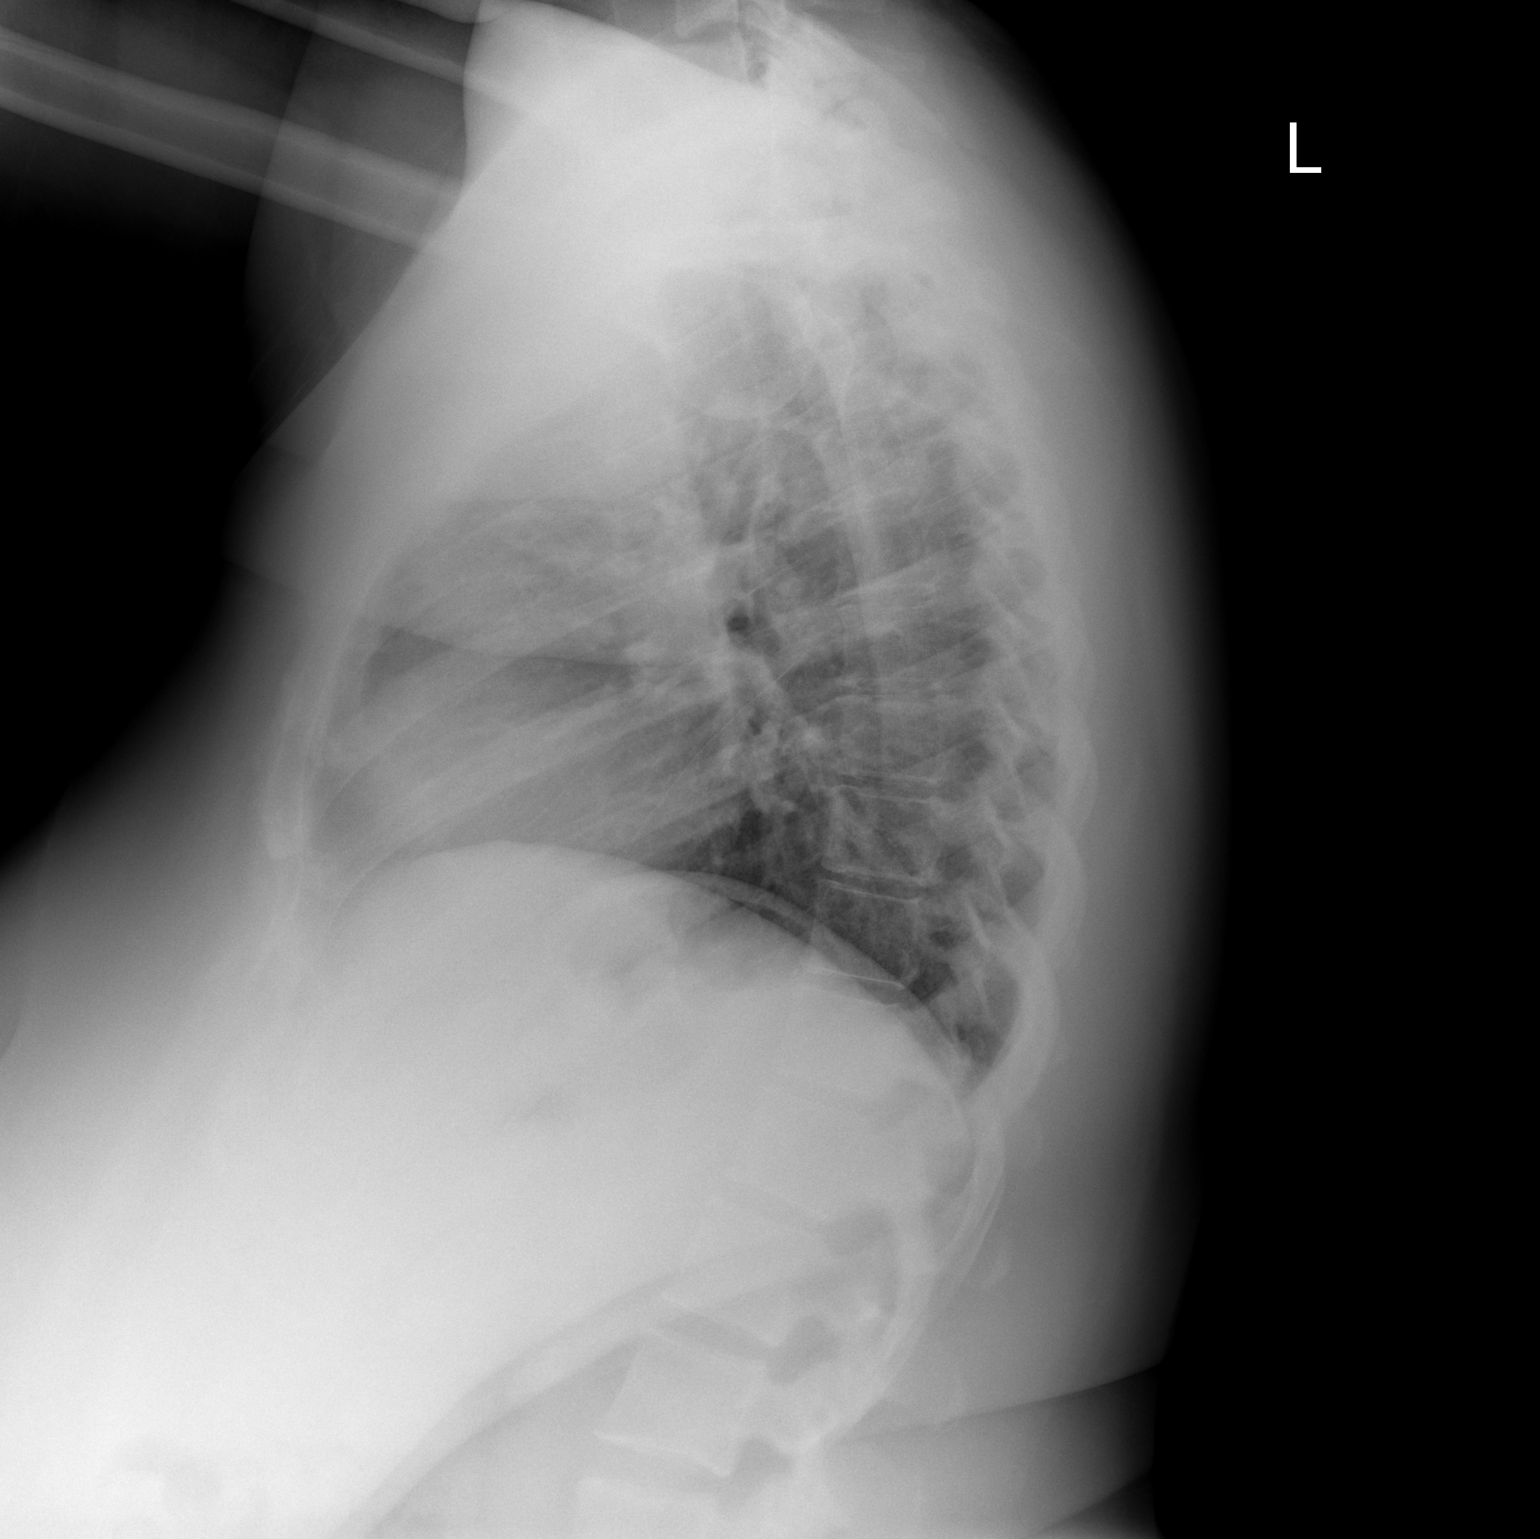

[2 of 2 positions shown; findings below may reference images not displayed]

FINDINGS: Lung volumes are normal. No consolidative airspace disease. No
pleural effusions. No pneumothorax. No pulmonary nodule or mass
noted. Pulmonary vasculature and the cardiomediastinal silhouette
are within normal limits.
IMPRESSION: No radiographic evidence of acute cardiopulmonary disease.

## 2017-08-31 ENCOUNTER — Encounter (HOSPITAL_BASED_OUTPATIENT_CLINIC_OR_DEPARTMENT_OTHER): Payer: Self-pay | Admitting: Emergency Medicine

## 2017-08-31 ENCOUNTER — Emergency Department (HOSPITAL_BASED_OUTPATIENT_CLINIC_OR_DEPARTMENT_OTHER)
Admission: EM | Admit: 2017-08-31 | Discharge: 2017-08-31 | Disposition: A | Discharge status: Home / Self Care | Payer: 59 | Attending: Emergency Medicine | Admitting: Emergency Medicine

## 2017-08-31 ENCOUNTER — Other Ambulatory Visit: Payer: Self-pay

## 2017-08-31 DIAGNOSIS — Z79899 Other long term (current) drug therapy: Secondary | ICD-10-CM | POA: Insufficient documentation

## 2017-08-31 DIAGNOSIS — R1030 Lower abdominal pain, unspecified: Secondary | ICD-10-CM | POA: Diagnosis present

## 2017-08-31 DIAGNOSIS — N390 Urinary tract infection, site not specified: Secondary | ICD-10-CM | POA: Insufficient documentation

## 2017-08-31 LAB — URINALYSIS, ROUTINE W REFLEX MICROSCOPIC
Bilirubin Urine: NEGATIVE
Glucose, UA: NEGATIVE mg/dL
KETONES UR: NEGATIVE mg/dL
Nitrite: NEGATIVE
PROTEIN: NEGATIVE mg/dL
Specific Gravity, Urine: >1.03 — ABNORMAL HIGH (ref 1.005–1.030)
pH: 6 (ref 5.0–8.0)

## 2017-08-31 LAB — URINALYSIS, MICROSCOPIC (REFLEX)

## 2017-08-31 MED ORDER — NITROFURANTOIN MONOHYD MACRO 100 MG PO CAPS
100.0000 mg | ORAL_CAPSULE | Freq: Once | ORAL | Status: AC
  Administered 2017-08-31: 100 mg via ORAL
  Filled 2017-08-31: qty 1

## 2017-08-31 MED ORDER — PHENAZOPYRIDINE HCL 100 MG PO TABS
200.0000 mg | ORAL_TABLET | Freq: Once | ORAL | Status: AC
  Administered 2017-08-31: 200 mg via ORAL
  Filled 2017-08-31: qty 2

## 2017-08-31 MED ORDER — NITROFURANTOIN MONOHYD MACRO 100 MG PO CAPS: 100.0000 mg | ORAL_CAPSULE | Freq: Two times a day (BID) | ORAL | 0 refills | Status: DC

## 2017-08-31 MED ORDER — PHENAZOPYRIDINE HCL 200 MG PO TABS: 200.0000 mg | ORAL_TABLET | Freq: Three times a day (TID) | ORAL | 0 refills | Status: DC

## 2017-08-31 NOTE — ED Triage Notes (Signed)
Pt presents with lower back pain and  vaginal discharge today and abdominal pain  and nausea yesterday.

## 2017-08-31 NOTE — ED Provider Notes (Signed)
[facility] EMERGENCY DEPARTMENT Provider Note   CSN: [id number] Arrival date & time: 08/31/17  0129     History   Chief Complaint Chief Complaint  Patient presents with  . Back Pain    HPI Joanna Cross is a 42 y.o. female.  The history is provided by the patient.  Abdominal Pain   This is a recurrent problem. The current episode started more than 1 week ago. The problem occurs constantly. The problem has not changed since onset.The pain is associated with an unknown factor. The pain is located in the suprapubic region. The quality of the pain is aching. The pain is moderate. Associated symptoms include frequency. Pertinent negatives include fever and dysuria. Associated symptoms comments: Low back pain, also achy x 1 week. Nothing aggravates the symptoms. Nothing relieves the symptoms. Past workup includes surgery. Her past medical history is significant for gallstones.  States this feels like a previous UTi but "I could not wait for my doctor to see me January 25th."  Past Medical History:  Diagnosis Date  . Anxiety     Patient Active Problem List   Diagnosis Date Noted  . Acute cholecystitis s/p lap cholecystectomy 04/13/2016 04/13/2016    Past Surgical History:  Procedure Laterality Date  . CESAREAN SECTION    . LAPAROSCOPIC CHOLECYSTECTOMY SINGLE SITE WITH INTRAOPERATIVE CHOLANGIOGRAM N/A 04/13/2016   Procedure: LAPAROSCOPIC CHOLECYSTECTOMY WITH SINGLE SITE with intraoperative cholangiogram;  Surgeon: Karie Soda, MD;  Location: [facility];  Service: General;  Laterality: N/A;  . TUBAL LIGATION      OB History    No data available       Home Medications    Prior to Admission medications   Medication Sig Start Date End Date Taking? Authorizing Provider  nitrofurantoin, macrocrystal-monohydrate, (MACROBID) 100 MG capsule Take 1 capsule (100 mg total) by mouth 2 (two) times daily. X 7 days 08/31/17   Noni Stonesifer, MD  phenazopyridine (PYRIDIUM) 200 MG  tablet Take 1 tablet (200 mg total) by mouth 3 (three) times daily. 08/31/17   Ricahrd Schwager, MD  sertraline (ZOLOFT) 25 MG tablet Take 25 mg by mouth daily.    [provider]  traMADol (ULTRAM) 50 MG tablet Take 1-2 tablets (50-100 mg total) by mouth every 6 (six) hours as needed for moderate pain or severe pain. 04/14/16   Franne Forts, PA-C    Family History No family history on file.  Social History Social History   Tobacco Use  . Smoking status: Never Smoker  . Smokeless tobacco: Never Used  Substance Use Topics  . Alcohol use: No  . Drug use: No     Allergies   Codeine   Review of Systems Review of Systems  Constitutional: Negative for fever.  Gastrointestinal: Positive for abdominal pain.       Suprapubic  Genitourinary: Positive for frequency. Negative for difficulty urinating, dysuria, flank pain, vaginal bleeding, vaginal discharge and vaginal pain.  All other systems reviewed and are negative.    Physical Exam Updated Vital Signs BP 134/70 (BP Location: Right Arm)   Pulse 72   Temp 98.2 F (36.8 C) (Oral)   Resp 18   LMP 08/13/2017   SpO2 99%   Physical Exam  Constitutional: She is oriented to person, place, and time. She appears well-developed and well-nourished. No distress.  HENT:  Head: Normocephalic and atraumatic.  Mouth/Throat: No oropharyngeal exudate.  Eyes: Conjunctivae and EOM are normal. Pupils are equal, round, and reactive to light.  Neck:  Normal range of motion. Neck supple.  Cardiovascular: Normal rate, regular rhythm, normal heart sounds and intact distal pulses.  Pulmonary/Chest: Effort normal and breath sounds normal. No stridor. She has no wheezes. She has no rales.  Abdominal: Soft. Bowel sounds are normal. She exhibits no mass. There is no tenderness. There is no rebound and no guarding.  Musculoskeletal: Normal range of motion.  Neurological: She is alert and oriented to person, place, and time.  Skin: Skin is warm  and dry. Capillary refill takes less than 2 seconds.  Psychiatric: She has a normal mood and affect.     ED Treatments / Results  Labs (all labs ordered are listed, but only abnormal results are displayed)  Results for orders placed or performed during the hospital encounter of 08/31/17  Urinalysis, Routine w reflex microscopic  Result Value Ref Range   Color, Urine YELLOW YELLOW   APPearance CLOUDY (A) CLEAR   Specific Gravity, Urine >1.030 (H) 1.005 - 1.030   pH 6.0 5.0 - 8.0   Glucose, UA NEGATIVE NEGATIVE mg/dL   Hgb urine dipstick SMALL (A) NEGATIVE   Bilirubin Urine NEGATIVE NEGATIVE   Ketones, ur NEGATIVE NEGATIVE mg/dL   Protein, ur NEGATIVE NEGATIVE mg/dL   Nitrite NEGATIVE NEGATIVE   Leukocytes, UA MODERATE (A) NEGATIVE  Urinalysis, Microscopic (reflex)  Result Value Ref Range   RBC / HPF 6-30 0 - 5 RBC/hpf   WBC, UA TOO NUMEROUS TO COUNT 0 - 5 WBC/hpf   Bacteria, UA MANY (A) NONE SEEN   Squamous Epithelial / LPF 6-30 (A) NONE SEEN   No results found.   Procedures Procedures (including critical care time)  Medications Ordered in ED Medications  nitrofurantoin (macrocrystal-monohydrate) (MACROBID) capsule 100 mg (100 mg Oral Given 08/31/17 0333)  phenazopyridine (PYRIDIUM) tablet 200 mg (200 mg Oral Given 08/31/17 0333)      Final Clinical Impressions(s) / ED Diagnoses   Final diagnoses:  Urinary tract infection without hematuria, site unspecified    Return for worsening pain, vomiting blood inability to pass urine,  fevers > 100.4 unrelieved by medication, shortness of breath, intractable vomiting, or diarrhea, abdominal pain, Inability to tolerate liquids or food, cough, altered mental status or any concerns. No signs of systemic illness or infection. The patient is nontoxic-appearing on exam and vital signs are within normal limits.    I have reviewed the triage vital signs and the nursing notes. Pertinent labs &imaging results that were available  during my care of the patient were reviewed by me and considered in my medical decision making (see chart for details).  After history, exam, and medical workup I feel the patient has been appropriately medically screened and is safe for discharge home. Pertinent diagnoses were discussed with the patient. Patient was given return precautions.  ED Discharge Orders        Ordered    nitrofurantoin, macrocrystal-monohydrate, (MACROBID) 100 MG capsule  2 times daily     08/31/17 0328    phenazopyridine (PYRIDIUM) 200 MG tablet  3 times daily     08/31/17 0328       Lionell Matuszak, MD 08/31/17 [id number]

## 2017-10-05 ENCOUNTER — Encounter (HOSPITAL_BASED_OUTPATIENT_CLINIC_OR_DEPARTMENT_OTHER): Payer: Self-pay | Admitting: Emergency Medicine

## 2017-10-05 ENCOUNTER — Other Ambulatory Visit: Payer: Self-pay

## 2017-10-05 ENCOUNTER — Emergency Department (HOSPITAL_BASED_OUTPATIENT_CLINIC_OR_DEPARTMENT_OTHER)
Admission: EM | Admit: 2017-10-05 | Discharge: 2017-10-05 | Disposition: A | Discharge status: Home / Self Care | Payer: 59 | Attending: Emergency Medicine | Admitting: Emergency Medicine

## 2017-10-05 DIAGNOSIS — J069 Acute upper respiratory infection, unspecified: Secondary | ICD-10-CM | POA: Diagnosis not present

## 2017-10-05 DIAGNOSIS — R197 Diarrhea, unspecified: Secondary | ICD-10-CM | POA: Insufficient documentation

## 2017-10-05 DIAGNOSIS — Z79899 Other long term (current) drug therapy: Secondary | ICD-10-CM | POA: Diagnosis not present

## 2017-10-05 DIAGNOSIS — R05 Cough: Secondary | ICD-10-CM | POA: Diagnosis present

## 2017-10-05 DIAGNOSIS — B9789 Other viral agents as the cause of diseases classified elsewhere: Secondary | ICD-10-CM

## 2017-10-05 MED ORDER — BENZONATATE 100 MG PO CAPS: 100.0000 mg | ORAL_CAPSULE | Freq: Three times a day (TID) | ORAL | 0 refills | Status: DC

## 2017-10-05 MED ORDER — ACETAMINOPHEN 500 MG PO TABS: 500.0000 mg | ORAL_TABLET | Freq: Four times a day (QID) | ORAL | 0 refills | Status: AC | PRN

## 2017-10-05 MED ORDER — CETIRIZINE-PSEUDOEPHEDRINE ER 5-120 MG PO TB12: 1.0000 | ORAL_TABLET | Freq: Every day | ORAL | 0 refills | Status: DC

## 2017-10-05 MED ORDER — IBUPROFEN 600 MG PO TABS: 600.0000 mg | ORAL_TABLET | Freq: Four times a day (QID) | ORAL | 0 refills | Status: DC | PRN

## 2017-10-05 MED FILL — ACETAMINOPHEN EXTRA STRENGT: 500 | 25 days supply | Qty: 100 | Fill #0

## 2017-10-05 MED FILL — BENZONATATE 100 MG CAPSULE: 100 | 7 days supply | Qty: 21 | Fill #0

## 2017-10-05 MED FILL — IBUPROFEN 600 MG TABLET: 600 | 7 days supply | Qty: 30 | Fill #0

## 2017-10-05 MED FILL — SM ALL DAY ALLERGY-D TABLET: 5-120 | 24 days supply | Qty: 24 | Fill #0

## 2017-10-05 NOTE — Discharge Instructions (Signed)
Medications: Tessalon, Zyrtec-D, ibuprofen, Tylenol  Treatment: Take Tessalon every 8 hours as needed for cough.  Take Zyrtec-D twice daily as needed for nasal congestion.  Take ibuprofen and Tylenol alternating every 3 hours, or take one kind every 6 hours.  Make sure to get plenty of rest and drink plenty of fluids.  Follow-up: Please follow-up with your doctor or return to the emergency department if you develop any new or worsening symptoms including shortness of breath, severe chest pain, or any other new concerning symptom.

## 2017-10-05 NOTE — ED Provider Notes (Signed)
[facility] EMERGENCY DEPARTMENT Provider Note   CSN: [id number] Arrival date & time: 10/05/17  4098     History   Chief Complaint Chief Complaint  Patient presents with  . Cough    HPI Joanna Cross is a 42 y.o. female with history of anxiety who presents with a 4-day history of cough, nasal congestion, generalized body aches, and headaches.  Patient has been taking NyQuil at home without relief.  She denies any abdominal pain, nausea, vomiting, chest pain, or shortness of breath.  Patient reports the first day of her symptoms she did have a few nonbloody, loose stools, but none since.  HPI  Past Medical History:  Diagnosis Date  . Anxiety     Patient Active Problem List   Diagnosis Date Noted  . Acute cholecystitis s/p lap cholecystectomy 04/13/2016 04/13/2016    Past Surgical History:  Procedure Laterality Date  . CESAREAN SECTION    . LAPAROSCOPIC CHOLECYSTECTOMY SINGLE SITE WITH INTRAOPERATIVE CHOLANGIOGRAM N/A 04/13/2016   Procedure: LAPAROSCOPIC CHOLECYSTECTOMY WITH SINGLE SITE with intraoperative cholangiogram;  Surgeon: Karie Soda, MD;  Location: [facility];  Service: General;  Laterality: N/A;  . TUBAL LIGATION      OB History    No data available       Home Medications    Prior to Admission medications   Medication Sig Start Date End Date Taking? Authorizing Provider  acetaminophen (TYLENOL) 500 MG tablet Take 1 tablet (500 mg total) by mouth every 6 (six) hours as needed. 10/05/17   Lissandro Dilorenzo, Waylan Boga, PA-C  benzonatate (TESSALON) 100 MG capsule Take 1 capsule (100 mg total) by mouth every 8 (eight) hours. 10/05/17   Dorinda Stehr, Waylan Boga, PA-C  cetirizine-pseudoephedrine (ZYRTEC-D) 5-120 MG tablet Take 1 tablet by mouth daily. 10/05/17   Kealan Buchan, Waylan Boga, PA-C  ibuprofen (ADVIL,MOTRIN) 600 MG tablet Take 1 tablet (600 mg total) by mouth every 6 (six) hours as needed. 10/05/17   Curvin Hunger, Waylan Boga, PA-C  nitrofurantoin, macrocrystal-monohydrate, (MACROBID)  100 MG capsule Take 1 capsule (100 mg total) by mouth 2 (two) times daily. X 7 days 08/31/17   Palumbo, April, MD  phenazopyridine (PYRIDIUM) 200 MG tablet Take 1 tablet (200 mg total) by mouth 3 (three) times daily. 08/31/17   Palumbo, April, MD  sertraline (ZOLOFT) 25 MG tablet Take 25 mg by mouth daily.    [provider]  traMADol (ULTRAM) 50 MG tablet Take 1-2 tablets (50-100 mg total) by mouth every 6 (six) hours as needed for moderate pain or severe pain. 04/14/16   Meuth, Lina Sar, PA-C    Family History History reviewed. No pertinent family history.  Social History Social History   Tobacco Use  . Smoking status: Never Smoker  . Smokeless tobacco: Never Used  Substance Use Topics  . Alcohol use: No  . Drug use: No     Allergies   Codeine   Review of Systems Review of Systems  Constitutional: Negative for fever.  HENT: Positive for congestion, sneezing and sore throat (itchy). Negative for ear pain.   Respiratory: Positive for cough. Negative for shortness of breath.   Cardiovascular: Negative for chest pain.  Gastrointestinal: Positive for diarrhea (1 day loose stools). Negative for vomiting.     Physical Exam Updated Vital Signs BP 122/81 (BP Location: Right Arm)   Pulse 90   Temp 98.9 F (37.2 C) (Oral)   Resp 18   Ht 5\' 8"  (1.727 m)   Wt 117.9 kg (260 lb)  LMP 08/31/2017   SpO2 100%   BMI 39.53 kg/m   Physical Exam  Constitutional: She appears well-developed and well-nourished. No distress.  HENT:  Head: Normocephalic and atraumatic.  Mouth/Throat: Oropharynx is clear and moist. No oropharyngeal exudate, posterior oropharyngeal edema, posterior oropharyngeal erythema or tonsillar abscesses. Tonsils are 2+ on the right. Tonsils are 2+ on the left. No tonsillar exudate.  Eyes: Conjunctivae are normal. Pupils are equal, round, and reactive to light. Right eye exhibits no discharge. Left eye exhibits no discharge. No scleral icterus.  Neck: Normal  range of motion. Neck supple. No thyromegaly present.  Cardiovascular: Normal rate, regular rhythm, normal heart sounds and intact distal pulses. Exam reveals no gallop and no friction rub.  No murmur heard. Pulmonary/Chest: Effort normal and breath sounds normal. No stridor. No respiratory distress. She has no wheezes. She has no rales.  Abdominal: Soft. Bowel sounds are normal. She exhibits no distension. There is no tenderness. There is no rebound and no guarding.  Musculoskeletal: She exhibits no edema.  Lymphadenopathy:    She has no cervical adenopathy.  Neurological: She is alert. Coordination normal.  Skin: Skin is warm and dry. No rash noted. She is not diaphoretic. No pallor.  Psychiatric: She has a normal mood and affect.  Nursing note and vitals reviewed.    ED Treatments / Results  Labs (all labs ordered are listed, but only abnormal results are displayed) Labs Reviewed - No data to display  EKG  EKG Interpretation None       Radiology No results found.  Procedures Procedures (including critical care time)  Medications Ordered in ED Medications - No data to display   Initial Impression / Assessment and Plan / ED Course  I have reviewed the triage vital signs and the nursing notes.  Pertinent labs & imaging results that were available during my care of the patient were reviewed by me and considered in my medical decision making (see chart for details).    Pt symptoms consistent with URI versus viral syndrome.  Lungs are clear to auscultation.  No indication for chest x-ray at this time.  Pt will be discharged with symptomatic treatment, including ibuprofen, Tylenol, Zyrtec-D, Tessalon.  Discussed return precautions.  Patient understands and agrees with plan.  Patient vitals stable throughout ED course and discharged in satisfactory condition.   Final Clinical Impressions(s) / ED Diagnoses   Final diagnoses:  Viral URI with cough    ED Discharge Orders          Ordered    ibuprofen (ADVIL,MOTRIN) 600 MG tablet  Every 6 hours PRN     10/05/17 1008    acetaminophen (TYLENOL) 500 MG tablet  Every 6 hours PRN     10/05/17 1008    cetirizine-pseudoephedrine (ZYRTEC-D) 5-120 MG tablet  Daily     10/05/17 1008    benzonatate (TESSALON) 100 MG capsule  Every 8 hours     10/05/17 1008       LawWaylan Boga, Jaquasia Doscher M, PA-C 10/05/17 1009    Shaune PollackIsaacs, Cameron, MD 10/06/17 [phone]

## 2017-10-05 NOTE — ED Triage Notes (Signed)
Patient states that she has a cough starting about 2 days; Paitent states that she started to have body aches and Headache "for days"

## 2018-02-13 ENCOUNTER — Other Ambulatory Visit: Payer: Self-pay | Admitting: Physician Assistant

## 2018-02-13 DIAGNOSIS — Z1231 Encounter for screening mammogram for malignant neoplasm of breast: Secondary | ICD-10-CM

## 2018-03-14 ENCOUNTER — Ambulatory Visit
Admission: RE | Admit: 2018-03-14 | Discharge: 2018-03-14 | Disposition: A | Discharge status: Home / Self Care | Payer: 59 | Source: Ambulatory Visit | Attending: Physician Assistant | Admitting: Physician Assistant

## 2018-03-14 DIAGNOSIS — Z1231 Encounter for screening mammogram for malignant neoplasm of breast: Secondary | ICD-10-CM

## 2019-04-27 ENCOUNTER — Other Ambulatory Visit: Payer: Self-pay | Admitting: Physician Assistant

## 2019-04-27 DIAGNOSIS — Z1231 Encounter for screening mammogram for malignant neoplasm of breast: Secondary | ICD-10-CM

## 2019-06-14 ENCOUNTER — Ambulatory Visit
Admission: RE | Admit: 2019-06-14 | Discharge: 2019-06-14 | Disposition: A | Discharge status: Home / Self Care | Payer: 59 | Source: Ambulatory Visit | Attending: Physician Assistant | Admitting: Physician Assistant

## 2019-06-14 ENCOUNTER — Other Ambulatory Visit: Payer: Self-pay

## 2019-06-14 DIAGNOSIS — Z1231 Encounter for screening mammogram for malignant neoplasm of breast: Secondary | ICD-10-CM

## 2019-10-27 ENCOUNTER — Ambulatory Visit: Payer: 59 | Attending: Internal Medicine

## 2019-10-27 DIAGNOSIS — Z23 Encounter for immunization: Secondary | ICD-10-CM

## 2019-10-27 NOTE — Progress Notes (Signed)
   Covid-19 Vaccination Clinic  Name:  Joanna Cross    MRN: [id number] DOB: [birth year]  10/27/2019  Joanna Cross was observed post Covid-19 immunization for 15 minutes without incident. She was provided with Vaccine Information Sheet and instruction to access the V-Safe system.   Joanna Cross was instructed to call 911 with any severe reactions post vaccine: Marland Kitchen Difficulty breathing  . Swelling of face and throat  . A fast heartbeat  . A bad rash all over body  . Dizziness and weakness   Immunizations Administered    Name Date Dose VIS Date Route   Pfizer COVID-19 Vaccine 10/27/2019 12:21 PM 0.3 mL 07/27/2019 Intramuscular   Manufacturer: [employer], [employer]   Lot: [id number]   NDC: [address]-7

## 2019-11-20 ENCOUNTER — Ambulatory Visit: Payer: 59 | Attending: Internal Medicine

## 2019-11-20 DIAGNOSIS — Z23 Encounter for immunization: Secondary | ICD-10-CM

## 2019-11-20 NOTE — Progress Notes (Signed)
   Covid-19 Vaccination Clinic  Name:  Joanna Cross    MRN: [id number] DOB: 10/08/1975  11/20/2019  [name] was observed post Covid-19 immunization for 15 minutes without incident. She was provided with Vaccine Information Sheet and instruction to access the V-Safe system.   [name] was instructed to call 911 with any severe reactions post vaccine: Marland Kitchen Difficulty breathing  . Swelling of face and throat  . A fast heartbeat  . A bad rash all over body  . Dizziness and weakness   Immunizations Administered    Name Date Dose VIS Date Route   Pfizer COVID-19 Vaccine 11/20/2019 11:26 AM 0.3 mL 07/27/2019 Intramuscular   Manufacturer: [employer], [employer]   Lot: [id number]   NDC: [address]-9

## 2020-07-23 ENCOUNTER — Other Ambulatory Visit: Payer: Self-pay | Admitting: Physician Assistant

## 2020-07-23 DIAGNOSIS — Z0000 Encounter for general adult medical examination without abnormal findings: Secondary | ICD-10-CM

## 2020-07-28 ENCOUNTER — Other Ambulatory Visit: Payer: Self-pay | Admitting: Physician Assistant

## 2020-07-28 DIAGNOSIS — Z0000 Encounter for general adult medical examination without abnormal findings: Secondary | ICD-10-CM

## 2020-07-29 ENCOUNTER — Ambulatory Visit
Admission: RE | Admit: 2020-07-29 | Discharge: 2020-07-29 | Disposition: A | Discharge status: Home / Self Care | Payer: 59 | Source: Ambulatory Visit | Attending: Physician Assistant | Admitting: Physician Assistant

## 2020-07-29 ENCOUNTER — Other Ambulatory Visit: Payer: Self-pay

## 2020-07-29 DIAGNOSIS — Z0000 Encounter for general adult medical examination without abnormal findings: Secondary | ICD-10-CM

## 2021-06-25 ENCOUNTER — Other Ambulatory Visit: Payer: Self-pay | Admitting: Physician Assistant

## 2021-06-25 DIAGNOSIS — Z1231 Encounter for screening mammogram for malignant neoplasm of breast: Secondary | ICD-10-CM

## 2021-07-31 ENCOUNTER — Ambulatory Visit: Payer: 59

## 2021-10-13 IMAGING — MG DIGITAL SCREENING BILAT W/ TOMO W/ CAD
8 of 16 series · 8 of 40 positions shown · non-contrast
Comparison: Previous exam(s).

CLINICAL DATA: Screening.

EXAM:
DIGITAL SCREENING BILATERAL MAMMOGRAM WITH TOMO AND CAD

[R CC synth-2D]
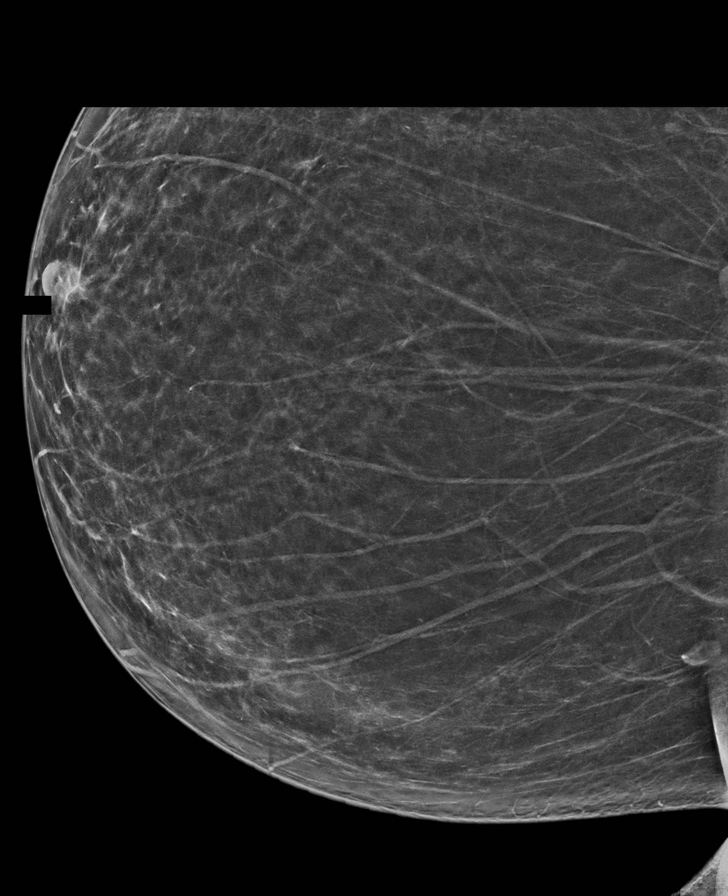

[R MLO synth-2D (1 of 3)]
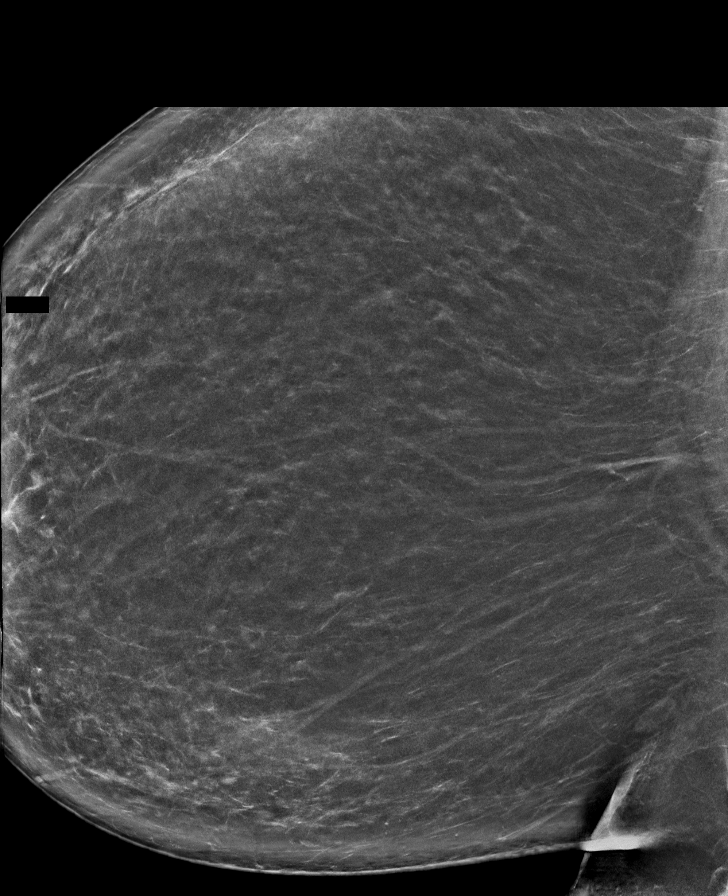

[L CC synth-2D (1 of 2)]
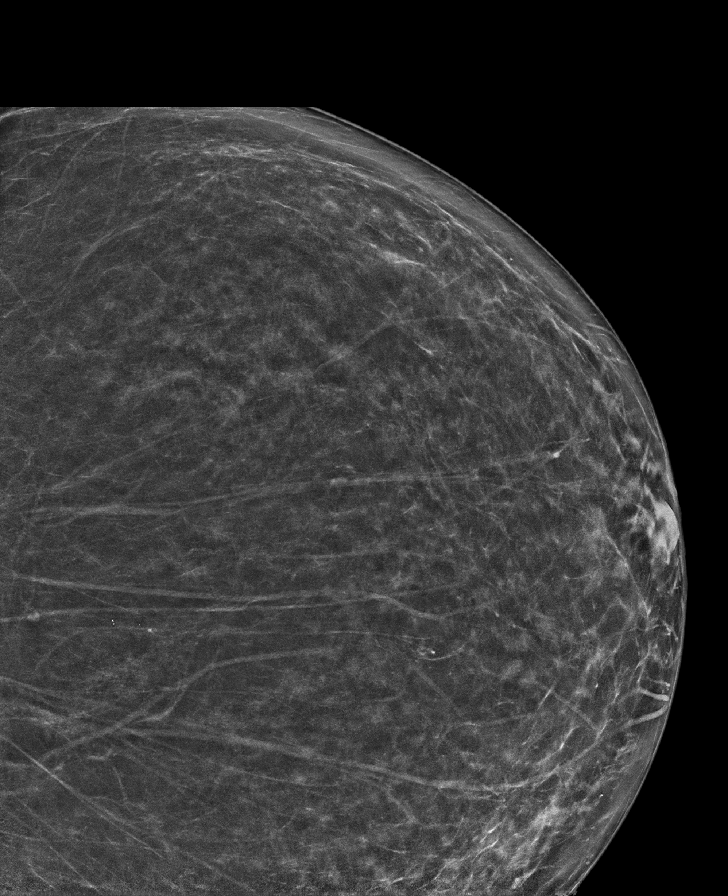

[R MLO synth-2D (2 of 3)]
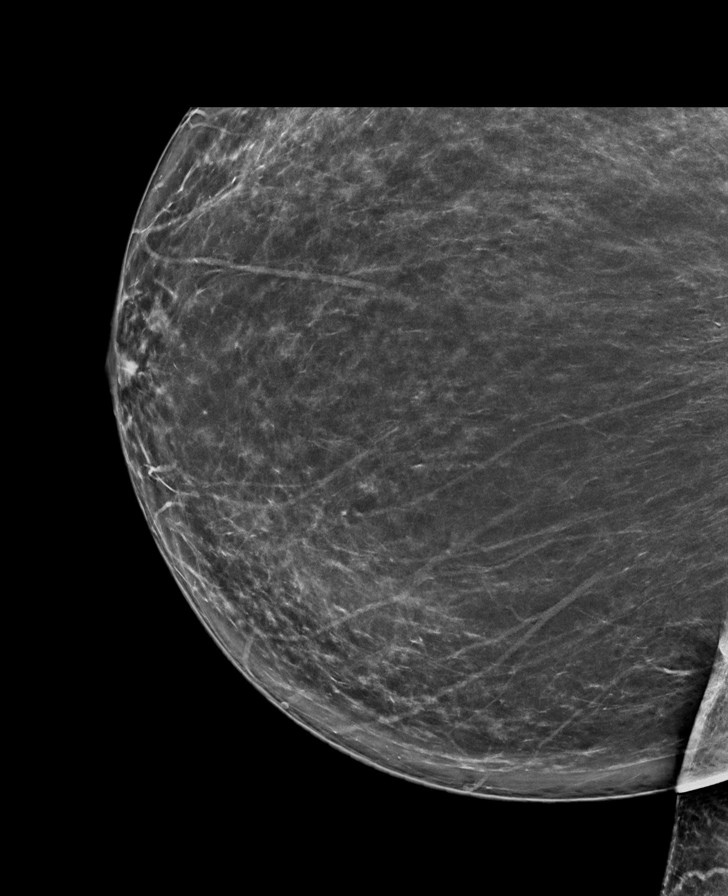

[L CC synth-2D (2 of 2)]
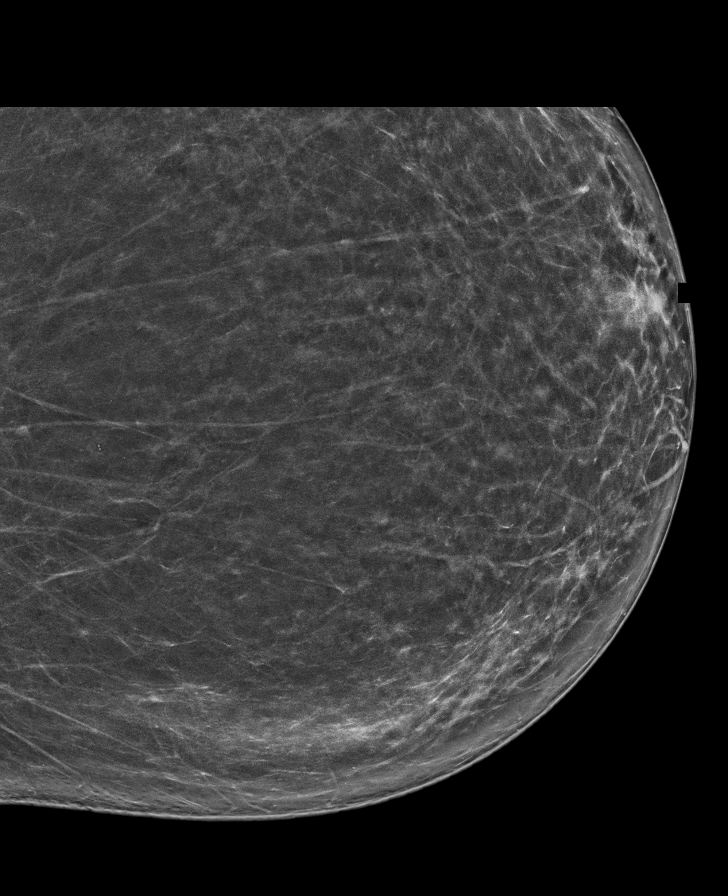

[R MLO synth-2D (3 of 3)]
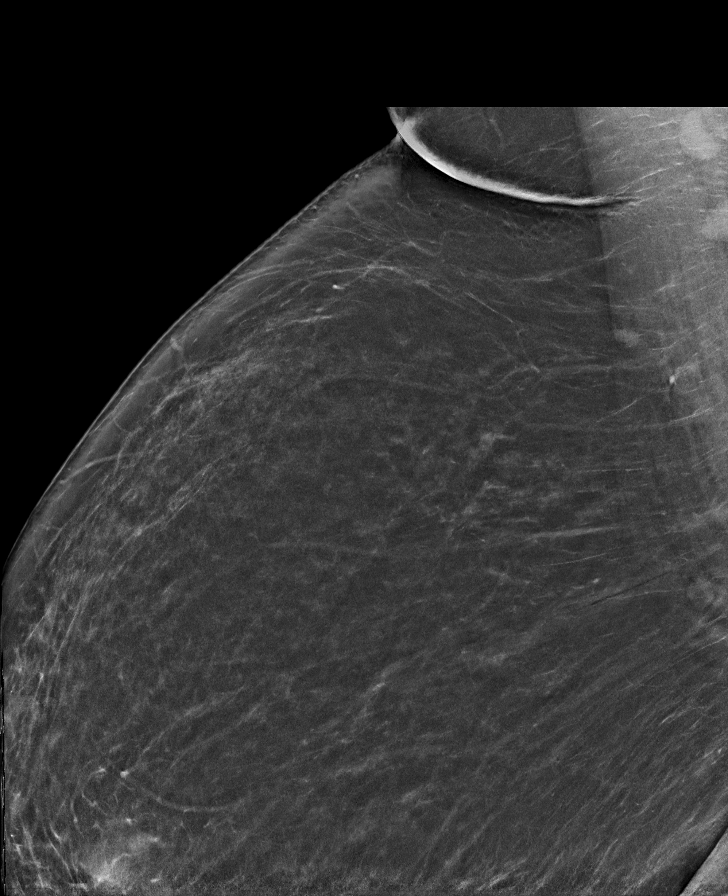

[L MLO synth-2D]
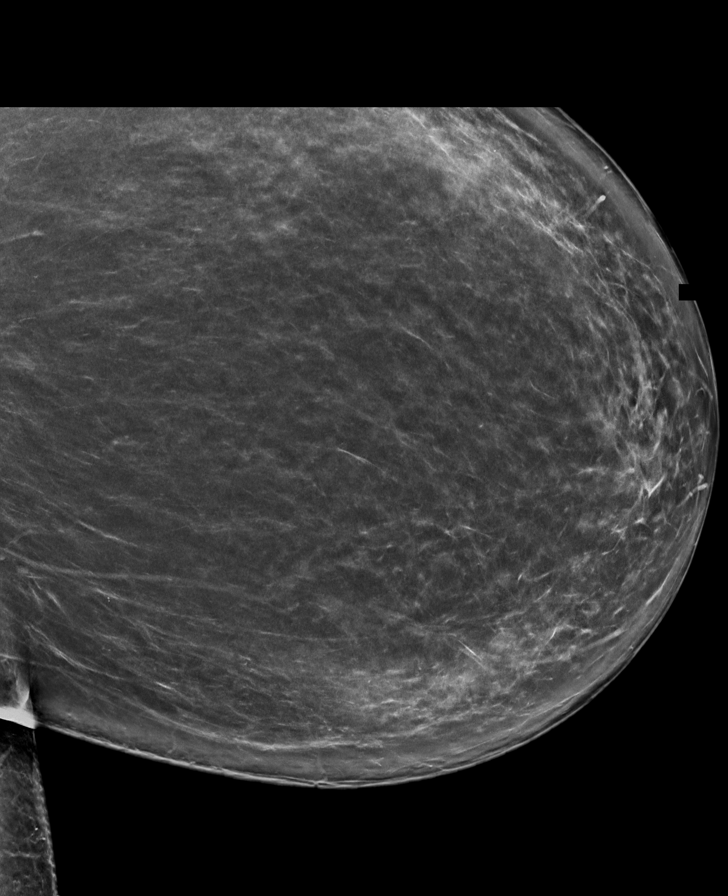

[L MLO tomo · tomo slice 51/102.0]
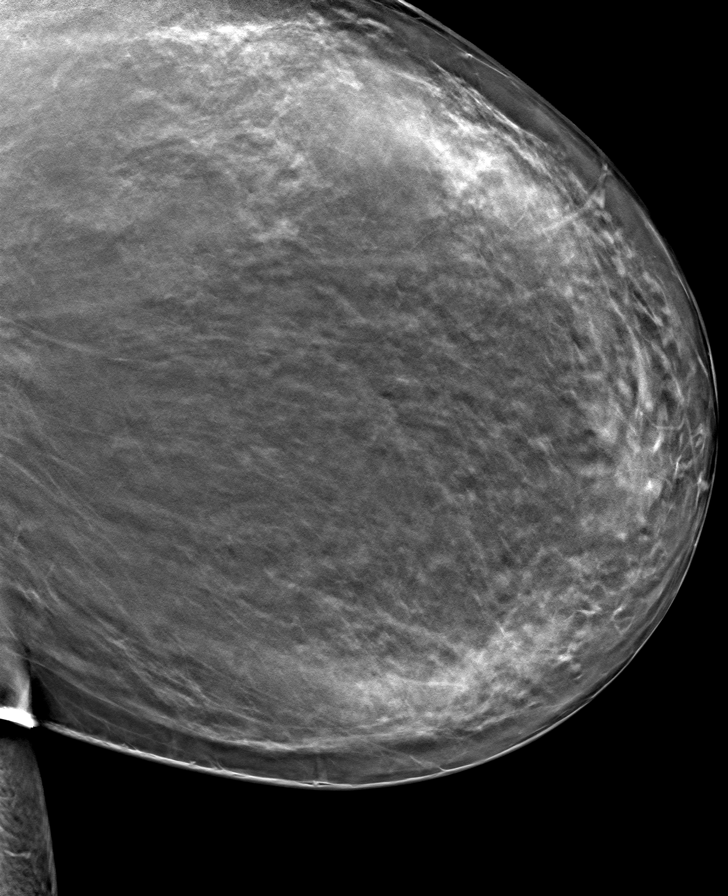

[8 of 40 positions shown; findings below may reference images not displayed]

ACR Breast Density Category b: There are scattered areas of
fibroglandular density.
FINDINGS: There are no findings suspicious for malignancy. Images were
processed with CAD.
IMPRESSION: No mammographic evidence of malignancy. A result letter of this
screening mammogram will be mailed directly to the patient.

RECOMMENDATION:
Screening mammogram in one year. (Code:[id number])

BI-RADS CATEGORY  1: Negative.

## 2022-05-26 ENCOUNTER — Other Ambulatory Visit: Payer: Self-pay | Admitting: Physician Assistant

## 2022-05-27 ENCOUNTER — Other Ambulatory Visit: Payer: Self-pay | Admitting: Physician Assistant

## 2022-05-27 DIAGNOSIS — Z1231 Encounter for screening mammogram for malignant neoplasm of breast: Secondary | ICD-10-CM

## 2022-07-14 ENCOUNTER — Ambulatory Visit
Admission: RE | Admit: 2022-07-14 | Discharge: 2022-07-14 | Disposition: A | Discharge status: Home / Self Care | Payer: 59 | Source: Ambulatory Visit | Attending: Physician Assistant | Admitting: Physician Assistant

## 2022-07-14 DIAGNOSIS — Z1231 Encounter for screening mammogram for malignant neoplasm of breast: Secondary | ICD-10-CM

## 2023-05-31 ENCOUNTER — Other Ambulatory Visit: Payer: Self-pay | Admitting: Physician Assistant

## 2023-05-31 DIAGNOSIS — Z1231 Encounter for screening mammogram for malignant neoplasm of breast: Secondary | ICD-10-CM

## 2023-07-19 ENCOUNTER — Ambulatory Visit
Admission: RE | Admit: 2023-07-19 | Discharge: 2023-07-19 | Disposition: A | Discharge status: Home / Self Care | Payer: 59 | Source: Ambulatory Visit | Attending: Physician Assistant

## 2023-07-19 DIAGNOSIS — Z1231 Encounter for screening mammogram for malignant neoplasm of breast: Secondary | ICD-10-CM

## 2023-09-30 ENCOUNTER — Ambulatory Visit: Payer: 59 | Admitting: Allergy

## 2023-09-30 ENCOUNTER — Other Ambulatory Visit: Payer: Self-pay

## 2023-09-30 ENCOUNTER — Encounter: Payer: Self-pay | Admitting: Allergy

## 2023-09-30 VITALS — BP 136/90 | HR 91 | Temp 97.9°F | Resp 16 | Ht 68.0 in | Wt 298.2 lb

## 2023-09-30 DIAGNOSIS — K047 Periapical abscess without sinus: Secondary | ICD-10-CM

## 2023-09-30 DIAGNOSIS — L508 Other urticaria: Secondary | ICD-10-CM

## 2023-09-30 DIAGNOSIS — G4700 Insomnia, unspecified: Secondary | ICD-10-CM

## 2023-09-30 DIAGNOSIS — L509 Urticaria, unspecified: Secondary | ICD-10-CM

## 2023-09-30 MED ORDER — FAMOTIDINE 20 MG PO TABS: 20.0000 mg | ORAL_TABLET | Freq: Two times a day (BID) | ORAL | 2 refills | Status: DC

## 2023-09-30 MED ORDER — CETIRIZINE HCL 10 MG PO TABS: 10.0000 mg | ORAL_TABLET | Freq: Two times a day (BID) | ORAL | 2 refills | Status: DC

## 2023-09-30 NOTE — Progress Notes (Signed)
New Patient Note  RE: Joanna Cross MRN: [id number] DOB: 1976-08-04 Date of Office Visit: 09/30/2023  Primary care provider: Reubin Milan, PA-C  Chief Complaint: Hives  History of present illness: Joanna Cross is a 48 y.o. female presenting today for evaluation of hives. Discussed the use of AI scribe software for clinical note transcription with the patient, who gave verbal consent to proceed.  The hives began last Wednesday evening, initially appearing on her arms. Despite taking Benadryl, the hives reappeared the following night. She attempted to change and wash her blankets and stayed in a different location, but the hives persisted. They primarily occur at night but have recently been observed in the morning, affecting her legs, arms, and abdomen.  She was prescribed Zyrtec (cetirizine HCL 10 mg) and hydroxyzine for itching, but neither has been effective. She takes Zyrtec once daily in the morning and hydroxyzine up to three times a day. Despite this, she continues to experience breakouts, including during the day at work, with itching on her back.  The hives are described as red, sometimes slightly raised, and have been worsening daily. No swelling of the lips, eyes, fingers, toes, or genitals, but she noticed swelling in her wrists, which subsided. She denies any new changes in diet, laundry products, clothing, or bedding. She recalls a past reaction to codeine where she developed hives but this is her first experience with random appearing hives. She has not had any recent illnesses or vaccinations that she associates with the onset of hives, except for an dental abscess treated with amoxicillin, which she has taken before without issue.  She went to the dentist Monday before the onset of the hives.  She also mentions taking Advil PM regularly for sleep.  No joint aches or pains worsening over the past week, except for chronic knee pain. She experienced chest pain and shortness  of breath earlier in the week, attributed to an anxiety attack. No bruising or purplish discoloration associated with the hives.     Review of systems: 10pt ROS negative unless noted in HPI  All other systems negative unless noted above in HPI  Past medical history: Past Medical History:  Diagnosis Date   Anxiety    Urticaria     Past surgical history: Past Surgical History:  Procedure Laterality Date   CESAREAN SECTION     LAPAROSCOPIC CHOLECYSTECTOMY SINGLE SITE WITH INTRAOPERATIVE CHOLANGIOGRAM N/A 04/13/2016   Procedure: LAPAROSCOPIC CHOLECYSTECTOMY WITH SINGLE SITE with intraoperative cholangiogram;  Surgeon: Karie Soda, MD;  Location: [facility];  Service: General;  Laterality: N/A;   TUBAL LIGATION      Family history:  Family History  Problem Relation Age of Onset   Allergic rhinitis Daughter    Eczema Daughter    Angioedema Neg Hx    Asthma Neg Hx    Urticaria Neg Hx     Social history: Lives in a home without carpeting with gas heating and central cooling.  No pets in the home.  No concern for water damage, mildew or roaches in the home.  She is an Nurse, adult.  He denies a smoking history.   Medication List: Current Outpatient Medications  Medication Sig Dispense Refill   acetaminophen (TYLENOL) 500 MG tablet Take 1 tablet (500 mg total) by mouth every 6 (six) hours as needed. 30 tablet 0   cetirizine (ZYRTEC ALLERGY) 10 MG tablet Take 1 tablet (10 mg total) by mouth 2 (two) times daily. 60 tablet 2   cetirizine-pseudoephedrine (  ZYRTEC-D) 5-120 MG tablet Take 1 tablet by mouth daily. 30 tablet 0   famotidine (PEPCID) 20 MG tablet Take 1 tablet (20 mg total) by mouth 2 (two) times daily. 60 tablet 2   hydrOXYzine (ATARAX) 10 MG tablet Take 10 mg by mouth 3 (three) times daily as needed for itching.     ibuprofen (ADVIL,MOTRIN) 600 MG tablet Take 1 tablet (600 mg total) by mouth every 6 (six) hours as needed. 30 tablet 0   Multiple Vitamin  (MULTIVITAMIN) tablet Take 2 tablets by mouth daily.     No current facility-administered medications for this visit.    Known medication allergies: Allergies  Allergen Reactions   Codeine Hives     Physical examination: Blood pressure (!) 136/90, pulse 91, temperature 97.9 F (36.6 C), temperature source Temporal, resp. rate 16, height 5\' 8"  (1.727 m), weight 298 lb 3.2 oz (135.3 kg), SpO2 98%.  General: Alert, interactive, in no acute distress. HEENT: PERRLA, TMs pearly gray, turbinates non-edematous without discharge, post-pharynx non erythematous. Neck: Supple without lymphadenopathy. Lungs: Clear to auscultation without wheezing, rhonchi or rales. {no increased work of breathing. CV: Normal S1, S2 without murmurs. Abdomen: Nondistended, nontender. Skin: Scattered erythematous urticarial type lesions primarily located on bilateral arms and back , nonvesicular. Extremities:  No clubbing, cyanosis or edema. Neuro:   Grossly intact.  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  Possible triggers include recent dental abscess. -Take Zyrtec 10mg  1 tab twice a day -Add Pepcid 20mg  1 tab twice daily for more complete histamine blockade. -Consider adding Singulair if no improvement in 1-2 weeks. -Consider Xolair if still no improvement. -Draw labs to rule out underlying environmental, autoimmune, food (red meat allergy) or blood cell, liver/kidney issues -Advise patient to discuss options with dentist to treat abscess.  Do not believe you have an amoxicillin allergy given timing of hive symptoms -Follow-up in 2-3 months or sooner if hives persist or worsen.  Dental Abscess Recent dental abscess treated with amoxicillin. Abscess may be the trigger for current urticarial outbreak. -Advise patient to follow-up with dentist for further management of abscess.  Insomnia Patient  reports taking Advil PM nightly for sleep. -Advise patient to consider alternative sleep hygiene measures, as Advil can potentially exacerbate hives.    Follow-up in 2-3 months or sooner if needed   I appreciate the opportunity to take part in Joanna Cross's care. Please do not hesitate to contact me with questions.  Sincerely,   Margo Aye, MD Allergy/Immunology [facility]

## 2023-09-30 NOTE — Patient Instructions (Signed)
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  Possible triggers include recent dental abscess. -Take Zyrtec 10mg  1 tab twice a day -Add Pepcid 20mg  1 tab twice daily for more complete histamine blockade. -Consider adding Singulair if no improvement in 1-2 weeks. -Consider Xolair if still no improvement. -Draw labs to rule out underlying environmental, autoimmune, food (red meat allergy) or blood cell, liver/kidney issues -Advise patient to discuss options with dentist to treat abscess.  Do not believe you have an amoxicillin allergy given timing of hive symptoms -Follow-up in 2-3 months or sooner if hives persist or worsen.  Dental Abscess Recent dental abscess treated with amoxicillin. Abscess may be the trigger for current urticarial outbreak. -Advise patient to follow-up with dentist for further management of abscess.  Insomnia Patient reports taking Advil PM nightly for sleep. -Advise patient to consider alternative sleep hygiene measures, as Advil can potentially exacerbate hives.    Follow-up in 2-3 months or sooner if needed

## 2023-10-12 ENCOUNTER — Encounter: Payer: Self-pay | Admitting: Allergy

## 2023-10-12 LAB — ALLERGENS W/TOTAL IGE AREA 2
Alternaria Alternata IgE: <0.1 kU/L
Aspergillus Fumigatus IgE: <0.1 kU/L
Bermuda Grass IgE: <0.1 kU/L
Cat Dander IgE: 14.2 kU/L — AB
Cedar, Mountain IgE: <0.1 kU/L
Cladosporium Herbarum IgE: <0.1 kU/L
Cockroach, German IgE: <0.1 kU/L
Common Silver Birch IgE: <0.1 kU/L
Cottonwood IgE: <0.1 kU/L
D Farinae IgE: <0.1 kU/L
D Pteronyssinus IgE: <0.1 kU/L
Dog Dander IgE: 0.73 kU/L — AB
Elm, American IgE: <0.1 kU/L
Johnson Grass IgE: <0.1 kU/L
Maple/Box Elder IgE: <0.1 kU/L
Mouse Urine IgE: <0.1 kU/L
Oak, White IgE: <0.1 kU/L
Pecan, Hickory IgE: <0.1 kU/L
Penicillium Chrysogen IgE: <0.1 kU/L
Pigweed, Rough IgE: <0.1 kU/L
Ragweed, Short IgE: <0.1 kU/L
Sheep Sorrel IgE Qn: <0.1 kU/L
Timothy Grass IgE: <0.1 kU/L
White Mulberry IgE: <0.1 kU/L

## 2023-10-12 LAB — CHRONIC URTICARIA: cu index: 4.9 (ref <10)

## 2023-10-12 LAB — CBC WITH DIFFERENTIAL/PLATELET
Basophils Absolute: 0 10*3/uL (ref 0.0–0.2)
Basos: 0 %
EOS (ABSOLUTE): 0.3 10*3/uL (ref 0.0–0.4)
Eos: 2 %
Hematocrit: 37.3 % (ref 34.0–46.6)
Hemoglobin: 12.1 g/dL (ref 11.1–15.9)
Immature Grans (Abs): 0 10*3/uL (ref 0.0–0.1)
Immature Granulocytes: 0 %
Lymphocytes Absolute: 3.4 10*3/uL — ABNORMAL HIGH (ref 0.7–3.1)
Lymphs: 30 %
MCH: 23.5 pg — ABNORMAL LOW (ref 26.6–33.0)
MCHC: 32.4 g/dL (ref 31.5–35.7)
MCV: 73 fL — ABNORMAL LOW (ref 79–97)
Monocytes Absolute: 0.7 10*3/uL (ref 0.1–0.9)
Monocytes: 6 %
Neutrophils Absolute: 7 10*3/uL (ref 1.4–7.0)
Neutrophils: 62 %
Platelets: 323 10*3/uL (ref 150–450)
RBC: 5.14 x10E6/uL (ref 3.77–5.28)
RDW: 15.8 % — ABNORMAL HIGH (ref 11.7–15.4)
WBC: 11.4 10*3/uL — ABNORMAL HIGH (ref 3.4–10.8)

## 2023-10-12 LAB — COMPREHENSIVE METABOLIC PANEL
ALT: 14 [IU]/L (ref 0–32)
AST: 15 [IU]/L (ref 0–40)
Albumin: 4.5 g/dL (ref 3.9–4.9)
Alkaline Phosphatase: 116 [IU]/L (ref 44–121)
BUN/Creatinine Ratio: 16 (ref 9–23)
BUN: 12 mg/dL (ref 6–24)
Bilirubin Total: <0.2 mg/dL (ref 0.0–1.2)
CO2: 21 mmol/L (ref 20–29)
Calcium: 9.9 mg/dL (ref 8.7–10.2)
Chloride: 102 mmol/L (ref 96–106)
Creatinine, Ser: 0.77 mg/dL (ref 0.57–1.00)
Globulin, Total: 3.4 g/dL (ref 1.5–4.5)
Glucose: 84 mg/dL (ref 70–99)
Potassium: 4.4 mmol/L (ref 3.5–5.2)
Sodium: 140 mmol/L (ref 134–144)
Total Protein: 7.9 g/dL (ref 6.0–8.5)
eGFR: 97 mL/min/{1.73_m2} (ref >59)

## 2023-10-12 LAB — ALPHA-GAL PANEL
Allergen Lamb IgE: <0.1 kU/L
Beef IgE: <0.1 kU/L
IgE (Immunoglobulin E), Serum: 75 [IU]/mL (ref 6–495)
O215-IgE Alpha-Gal: <0.1 kU/L
Pork IgE: <0.1 kU/L

## 2023-10-12 LAB — SEDIMENTATION RATE: Sed Rate: 70 mm/h — ABNORMAL HIGH (ref 0–32)

## 2023-10-12 LAB — THYROID ANTIBODIES (THYROPEROXIDASE & THYROGLOBULIN)
Thyroglobulin Antibody: <1 [IU]/mL (ref 0.0–0.9)
Thyroperoxidase Ab SerPl-aCnc: 14 [IU]/mL (ref 0–34)

## 2023-10-12 LAB — ANA W/REFLEX: Anti Nuclear Antibody (ANA): NEGATIVE

## 2023-10-12 LAB — TRYPTASE: Tryptase: 5.5 ug/L (ref 2.2–13.2)

## 2023-10-12 LAB — TSH: TSH: 2.31 u[IU]/mL (ref 0.450–4.500)

## 2023-11-02 ENCOUNTER — Encounter: Payer: Self-pay | Admitting: Allergy

## 2023-11-04 ENCOUNTER — Other Ambulatory Visit: Payer: Self-pay | Admitting: *Deleted

## 2023-11-04 MED ORDER — MONTELUKAST SODIUM 10 MG PO TABS: 10.0000 mg | ORAL_TABLET | Freq: Every day | ORAL | 5 refills | Status: DC

## 2023-11-11 ENCOUNTER — Ambulatory Visit: Admitting: Allergy

## 2023-11-11 ENCOUNTER — Other Ambulatory Visit: Payer: Self-pay

## 2023-11-11 ENCOUNTER — Encounter: Payer: Self-pay | Admitting: Allergy

## 2023-11-11 VITALS — BP 116/80 | HR 83 | Temp 97.7°F | Resp 16

## 2023-11-11 DIAGNOSIS — L509 Urticaria, unspecified: Secondary | ICD-10-CM

## 2023-11-11 NOTE — Patient Instructions (Addendum)
 Urticaria (Hives), chronic Not improving with current antihistamine regimen.   -Continue Zyrtec 10mg  1 tab twice a day.  Max dosing of Zyrtec is 4 tabs a day.  -Continue Pepcid 20mg  1 tab twice daily for more complete histamine blockade. -Start Singulair 10mg  daily at bedtime/evening. This is an antileukotriene that can help with both allergy and asthma symptom control.  If you notice any change in mood/behavior/sleep after starting Singulair then stop this medication and let us know.  Symptoms resolve after stopping the medication.  -Will proceed with Xolair monthly injections. Benefits, risks and protocol discussed today.  It can be done at home if comfortable administering after training in office.  Tammy, our Xolair coordinator, will contact you regarding the approval process.  -Hive labwork was largely unremarkable but did show positive allergens to dog and cat dander as well as elevated inflammatory markers.    Follow-up in 3-4 months or sooner if needed

## 2023-11-11 NOTE — Progress Notes (Signed)
 Follow-up Note  RE: TIANNAH GREENLY MRN: [id number] DOB: 1975/12/08 Date of Office Visit: 11/11/2023   History of present illness: Joanna Cross is a 48 y.o. female presenting today for follow-up of hives. She was last seen in the office on 09/30/23 by myself.  Discussed the use of AI scribe software for clinical note transcription with the patient, who gave verbal consent to proceed.  She has been experiencing chronic spontaneous hives that have become persistent and widespread and not getting any better. Initially, the hives appeared in the evening, but now they occur throughout the day and are present anywhere on body. She describes waking up with hives and feeling like her 'belly is full of them.'  Her current medication regimen includes high doses of Zyrtec and Pepcid, but she continues to experience breakthrough symptoms. Recently, Singulair was prescribed, but she has not started it due to concerns about potential side effects, particularly related to anxiety. In the past, she was prescribed hydroxyzine by a PA, but found it ineffective and has since discontinued its use.   She would like to proceed to Xolair as she wants control of these hives.     Review of systems: 10pt ROS negative unless noted above in HPI   Past medical/social/surgical/family history have been reviewed and are unchanged unless specifically indicated below.  No changes  Medication List: Current Outpatient Medications  Medication Sig Dispense Refill   acetaminophen (TYLENOL) 500 MG tablet Take 1 tablet (500 mg total) by mouth every 6 (six) hours as needed. 30 tablet 0   cetirizine (ZYRTEC ALLERGY) 10 MG tablet Take 1 tablet (10 mg total) by mouth 2 (two) times daily. 60 tablet 2   cetirizine-pseudoephedrine (ZYRTEC-D) 5-120 MG tablet Take 1 tablet by mouth daily. 30 tablet 0   famotidine (PEPCID) 20 MG tablet Take 1 tablet (20 mg total) by mouth 2 (two) times daily. 60 tablet 2   hydrOXYzine (ATARAX) 10  MG tablet Take 10 mg by mouth 3 (three) times daily as needed for itching.     ibuprofen (ADVIL,MOTRIN) 600 MG tablet Take 1 tablet (600 mg total) by mouth every 6 (six) hours as needed. 30 tablet 0   montelukast (SINGULAIR) 10 MG tablet Take 1 tablet (10 mg total) by mouth at bedtime. 30 tablet 5   Multiple Vitamin (MULTIVITAMIN) tablet Take 2 tablets by mouth daily.     No current facility-administered medications for this visit.     Known medication allergies: Allergies  Allergen Reactions   Codeine Hives     Physical examination: Blood pressure 116/80, pulse 83, temperature 97.7 F (36.5 C), resp. rate 16, SpO2 99%.  General: Alert, interactive, in no acute distress. HEENT: PERRLA, TMs pearly gray, turbinates non-edematous without discharge, post-pharynx non erythematous. Neck: Supple without lymphadenopathy. Lungs: Clear to auscultation without wheezing, rhonchi or rales. {no increased work of breathing. CV: Normal S1, S2 without murmurs. Abdomen: Nondistended, nontender. Skin: Scattered erythematous urticarial type lesions primarily located left forarm , nonvesicular. Extremities:  No clubbing, cyanosis or edema. Neuro:   Grossly intact.  Diagnositics/Labs: Labs: Component     Latest Ref Rng 09/30/2023  D Pteronyssinus IgE     Class 0 kU/L <0.10   D Farinae IgE     Class 0 kU/L <0.10   Cat Dander IgE     Class IV kU/L 14.20 !   Dog Dander IgE     Class II kU/L 0.73 !   [place] Grass IgE  Class 0 kU/L <0.10   Timothy Grass IgE     Class 0 kU/L <0.10   [place] Grass IgE     Class 0 kU/L <0.10   Cockroach, German IgE     Class 0 kU/L <0.10   Penicillium Chrysogen IgE     Class 0 kU/L <0.10   Cladosporium Herbarum IgE     Class 0 kU/L <0.10   Aspergillus Fumigatus IgE     Class 0 kU/L <0.10   Alternaria Alternata IgE     Class 0 kU/L <0.10   Maple/Box Elder IgE     Class 0 kU/L <0.10   Common Silver Charletta Cousin IgE     Class 0 kU/L <0.10   Cedar, [place]  IgE     Class 0 kU/L <0.10   Oak, White IgE     Class 0 kU/L <0.10   Elm, American IgE     Class 0 kU/L <0.10   C[place]wood IgE     Class 0 kU/L <0.10   Pecan, Hickory IgE     Class 0 kU/L <0.10   White Mulberry IgE     Class 0 kU/L <0.10   Ragweed, Short IgE     Class 0 kU/L <0.10   Pigweed, Rough IgE     Class 0 kU/L <0.10   Sheep Sorrel IgE Qn     Class 0 kU/L <0.10   Mouse Urine IgE     Class 0 kU/L <0.10   WBC     3.4 - 10.8 x10E3/uL 11.4 (H)   RBC     3.77 - 5.28 x10E6/uL 5.14   Hemoglobin     11.1 - 15.9 g/dL 78.2   HCT     95.6 - 21.3 % 37.3   MCV     79 - 97 fL 73 (L)   MCH     26.6 - 33.0 pg 23.5 (L)   MCHC     31.5 - 35.7 g/dL 08.6   RDW     57.8 - 46.9 % 15.8 (H)   Platelets     150 - 450 x10E3/uL 323   Neutrophils     Not Estab. % 62   Lymphs     Not Estab. % 30   Monocytes     Not Estab. % 6   Eos     Not Estab. % 2   Basos     Not Estab. % 0   NEUT#     1.4 - 7.0 x10E3/uL 7.0   Lymphs Abs     0.7 - 3.1 x10E3/uL 3.4 (H)   Monocytes Absolute     0.1 - 0.9 x10E3/uL 0.7   EOS (ABSOLUTE)     0.0 - 0.4 x10E3/uL 0.3   Basophils Absolute     0.0 - 0.2 x10E3/uL 0.0   Immature Granulocytes     Not Estab. % 0   Immature Grans (Abs)     0.0 - 0.1 x10E3/uL 0.0   Glucose     70 - 99 mg/dL 84   BUN     6 - 24 mg/dL 12   Creatinine     6.29 - 1.00 mg/dL 5.28   eGFR     >41 LK/GMW/1.02 97   BUN/Creatinine Ratio     9 - 23  16   Sodium     134 - 144 mmol/L 140   Potassium     3.5 - 5.2 mmol/L 4.4   Chloride  96 - 106 mmol/L 102   CO2     20 - 29 mmol/L 21   Calcium     8.7 - 10.2 mg/dL 9.9   Total Protein     6.0 - 8.5 g/dL 7.9   Albumin     3.9 - 4.9 g/dL 4.5   Globulin, Total     1.5 - 4.5 g/dL 3.4   Total Bilirubin     0.0 - 1.2 mg/dL <5.4   Alkaline Phosphatase     44 - 121 IU/L 116   AST     0 - 40 IU/L 15   ALT     0 - 32 IU/L 14   Class Description Allergens Comment   IgE (Immunoglobulin E), Serum     6 - 495  IU/mL 75   Pork IgE     Class 0 kU/L <0.10   Beef IgE     Class 0 kU/L <0.10   Allergen Lamb IgE     Class 0 kU/L <0.10   O215-IgE Alpha-Gal     Class 0 kU/L <0.10   Thyroperoxidase Ab SerPl-aCnc     0 - 34 IU/mL 14   Thyroglobulin Antibody     0.0 - 0.9 IU/mL <1.0   Anti Nuclear Antibody (ANA)     Negative  Negative   Sed Rate     0 - 32 mm/hr 70 (H)   Tryptase     2.2 - 13.2 ug/L 5.5   cu index     <10  4.9   TSH     0.450 - 4.500 uIU/mL 2.310    Assessment and plan: Urticaria (Hives), chronic Not improving with current antihistamine regimen.   -Continue Zyrtec 10mg  1 tab twice a day.  Max dosing of Zyrtec is 4 tabs a day.  -Continue Pepcid 20mg  1 tab twice daily for more complete histamine blockade. -Start Singulair 10mg  daily at bedtime/evening. This is an antileukotriene that can help with both allergy and asthma symptom control.  If you notice any change in mood/behavior/sleep after starting Singulair then stop this medication and let us know.  Symptoms resolve after stopping the medication.  -Will proceed with Xolair monthly injections. Benefits, risks and protocol discussed today.  It can be done at home if comfortable administering after training in office.  Tammy, our Xolair coordinator, will contact you regarding the approval process.  -Hive labwork was largely unremarkable but did show positive allergens to dog and cat dander as well as elevated inflammatory markers.    Follow-up in 3-4 months or sooner if needed  I appreciate the opportunity to take part in Alisha's care. Please do not hesitate to contact me with questions.  Sincerely,   Margo Aye, MD Allergy/Immunology [facility] 

## 2023-11-15 ENCOUNTER — Telehealth: Payer: Self-pay | Admitting: *Deleted

## 2023-11-15 NOTE — Telephone Encounter (Signed)
 Called patient and advised approval, copay card and submit to [facility] for Xolair. Will reach out once delivery set to make appt for start

## 2023-11-15 NOTE — Telephone Encounter (Signed)
-----   Message from Marcelyn Bruins sent at 11/11/2023  5:41 PM EDT ----- Proceed with Xolair approval for hive control.

## 2023-11-17 NOTE — Telephone Encounter (Signed)
 L/m for patient Xolair delivery can reach out to make appt to start therapy

## 2023-11-25 ENCOUNTER — Ambulatory Visit (INDEPENDENT_AMBULATORY_CARE_PROVIDER_SITE_OTHER)

## 2023-11-25 DIAGNOSIS — L501 Idiopathic urticaria: Secondary | ICD-10-CM | POA: Diagnosis not present

## 2023-11-25 MED ORDER — OMALIZUMAB 300 MG/2  ML ~~LOC~~ SOSY
300.0000 mg | PREFILLED_SYRINGE | Freq: Once | SUBCUTANEOUS | Status: AC
  Administered 2023-11-25: 300 mg via SUBCUTANEOUS

## 2023-12-22 ENCOUNTER — Ambulatory Visit

## 2023-12-22 DIAGNOSIS — L501 Idiopathic urticaria: Secondary | ICD-10-CM

## 2023-12-22 MED ORDER — OMALIZUMAB 300 MG/2  ML ~~LOC~~ SOSY
300.0000 mg | PREFILLED_SYRINGE | SUBCUTANEOUS | Status: AC
Start: 2023-12-22 — End: ?
  Administered 2023-12-22 – 2024-08-02 (×9): 300 mg via SUBCUTANEOUS

## 2023-12-22 MED ORDER — EPINEPHRINE 0.3 MG/0.3ML IJ SOAJ: 0.3000 mg | INTRAMUSCULAR | 1 refills | Status: DC | PRN

## 2023-12-23 ENCOUNTER — Ambulatory Visit

## 2024-01-09 NOTE — Patient Instructions (Incomplete)
 Urticaria (Hives), chronic Not improving with current antihistamine regimen and 2nd Xolair  injeciton -Continue Zyrtec  10mg  1 tab twice a day.  Max dosing of Zyrtec  is 4 tabs a day.  -Continue Pepcid  20mg  1 tab twice daily for more complete histamine blockade. -Continue Singulair  10mg  daily at bedtime/evening. This is an antileukotriene that can help with both allergy and asthma symptom control.  If you notice any change in mood/behavior/sleep after starting Singulair  then stop this medication and let us  know.  Symptoms resolve after stopping the medication.  -Continue Xolair  300 mg monthly injections.  -Hive labwork was largely unremarkable but did show positive allergens to dog and cat dander as well as elevated inflammatory markers.    Follow-up in 3-4 months or sooner if needed

## 2024-01-10 ENCOUNTER — Encounter: Payer: Self-pay | Admitting: Family

## 2024-01-10 ENCOUNTER — Encounter (HOSPITAL_BASED_OUTPATIENT_CLINIC_OR_DEPARTMENT_OTHER): Payer: Self-pay | Admitting: Emergency Medicine

## 2024-01-10 ENCOUNTER — Other Ambulatory Visit: Payer: Self-pay

## 2024-01-10 ENCOUNTER — Ambulatory Visit (INDEPENDENT_AMBULATORY_CARE_PROVIDER_SITE_OTHER): Admitting: Family

## 2024-01-10 ENCOUNTER — Emergency Department (HOSPITAL_BASED_OUTPATIENT_CLINIC_OR_DEPARTMENT_OTHER)
Admission: EM | Admit: 2024-01-10 | Discharge: 2024-01-10 | Disposition: A | Discharge status: Home / Self Care | Attending: Emergency Medicine | Admitting: Emergency Medicine

## 2024-01-10 ENCOUNTER — Emergency Department (HOSPITAL_BASED_OUTPATIENT_CLINIC_OR_DEPARTMENT_OTHER)

## 2024-01-10 VITALS — BP 132/78 | HR 105 | Temp 98.0°F | Resp 22 | Ht 67.0 in | Wt 305.5 lb

## 2024-01-10 DIAGNOSIS — T7840XA Allergy, unspecified, initial encounter: Secondary | ICD-10-CM | POA: Diagnosis present

## 2024-01-10 DIAGNOSIS — T783XXA Angioneurotic edema, initial encounter: Secondary | ICD-10-CM | POA: Diagnosis not present

## 2024-01-10 DIAGNOSIS — L501 Idiopathic urticaria: Secondary | ICD-10-CM

## 2024-01-10 DIAGNOSIS — T782XXA Anaphylactic shock, unspecified, initial encounter: Secondary | ICD-10-CM | POA: Diagnosis not present

## 2024-01-10 DIAGNOSIS — R000 Tachycardia, unspecified: Secondary | ICD-10-CM | POA: Diagnosis not present

## 2024-01-10 MED ORDER — EPINEPHRINE 0.3 MG/0.3ML IJ SOAJ
INTRAMUSCULAR | Status: AC
  Administered 2024-01-10: 0.3 mg via INTRAMUSCULAR
  Filled 2024-01-10: qty 0.3

## 2024-01-10 MED ORDER — EPINEPHRINE 0.3 MG/0.3ML IJ SOAJ: 0.3000 mg | Freq: Once | INTRAMUSCULAR | Status: AC

## 2024-01-10 MED ORDER — PREDNISONE 50 MG PO TABS: ORAL_TABLET | ORAL | 0 refills | Status: DC

## 2024-01-10 MED ORDER — METHYLPREDNISOLONE SODIUM SUCC 125 MG IJ SOLR
125.0000 mg | Freq: Once | INTRAMUSCULAR | Status: DC
  Filled 2024-01-10: qty 2

## 2024-01-10 MED ORDER — FAMOTIDINE IN NACL 20-0.9 MG/50ML-% IV SOLN
20.0000 mg | Freq: Once | INTRAVENOUS | Status: DC
  Filled 2024-01-10: qty 50

## 2024-01-10 MED ORDER — ONDANSETRON HCL 4 MG/2ML IJ SOLN
4.0000 mg | Freq: Once | INTRAMUSCULAR | Status: DC
  Filled 2024-01-10: qty 2

## 2024-01-10 MED ORDER — SODIUM CHLORIDE 0.9 % IV SOLN: INTRAVENOUS | Status: DC

## 2024-01-10 MED ORDER — DIPHENHYDRAMINE HCL 50 MG/ML IJ SOLN
25.0000 mg | Freq: Once | INTRAMUSCULAR | Status: DC
  Filled 2024-01-10: qty 1

## 2024-01-10 MED ORDER — FAMOTIDINE 20 MG PO TABS
20.0000 mg | ORAL_TABLET | Freq: Once | ORAL | Status: AC
  Administered 2024-01-10: 20 mg via ORAL
  Filled 2024-01-10: qty 1

## 2024-01-10 MED ORDER — SODIUM CHLORIDE 0.9 % IV BOLUS: 1000.0000 mL | Freq: Once | INTRAVENOUS | Status: DC

## 2024-01-10 MED ORDER — DIPHENHYDRAMINE HCL 50 MG/ML IJ SOLN
25.0000 mg | Freq: Once | INTRAMUSCULAR | Status: AC
  Administered 2024-01-10: 25 mg via INTRAMUSCULAR

## 2024-01-10 MED ORDER — METHYLPREDNISOLONE SODIUM SUCC 125 MG IJ SOLR
125.0000 mg | Freq: Once | INTRAMUSCULAR | Status: AC
  Administered 2024-01-10: 125 mg via INTRAMUSCULAR
  Filled 2024-01-10: qty 2

## 2024-01-10 NOTE — ED Provider Notes (Signed)
 [facility] Provider Note   CSN: [id number] Arrival date & time: 01/10/24  4098     History  Chief Complaint  Patient presents with   Allergic Reaction    Joanna Cross is a 48 y.o. female.  Patient with a history of chronic urticaria and anxiety here with throat swelling, lip swelling and difficulty breathing that onset about 1:30 AM and woke her from sleep.  States she has had chronic hives for several months and follows with allergist.  She takes Zyrtec , Pepcid  and Singulair  every day.  Waking up today with lower lip swelling and throat tightness and difficulty breathing and difficulty swallowing.  Did not take any medications at home.  This is never happened before.  Does not take any ACE inhibitor's or ARB's. Feels like her urticaria is worse too  The history is provided by the patient.  Allergic Reaction Presenting symptoms: difficulty swallowing and rash        Home Medications Prior to Admission medications   Medication Sig Start Date End Date Taking? Authorizing Provider  acetaminophen  (TYLENOL ) 500 MG tablet Take 1 tablet (500 mg total) by mouth every 6 (six) hours as needed. 10/05/17   Law, Alexandra M, PA-C  cetirizine  (ZYRTEC  ALLERGY) 10 MG tablet Take 1 tablet (10 mg total) by mouth 2 (two) times daily. 09/30/23   Brian Campanile, MD  cetirizine -pseudoephedrine  (ZYRTEC -D) 5-120 MG tablet Take 1 tablet by mouth daily. 10/05/17   Law, Alexandra M, PA-C  EPINEPHrine  0.3 mg/0.3 mL IJ SOAJ injection Inject 0.3 mg into the muscle as needed for anaphylaxis. 12/22/23   Brian Campanile, MD  famotidine  (PEPCID ) 20 MG tablet Take 1 tablet (20 mg total) by mouth 2 (two) times daily. 09/30/23   Brian Campanile, MD  hydrOXYzine (ATARAX) 10 MG tablet Take 10 mg by mouth 3 (three) times daily as needed for itching. 09/27/23   [provider]  ibuprofen  (ADVIL ,MOTRIN ) 600 MG tablet Take 1 tablet (600 mg  total) by mouth every 6 (six) hours as needed. 10/05/17   Law, Darnell Elbe, PA-C  montelukast  (SINGULAIR ) 10 MG tablet Take 1 tablet (10 mg total) by mouth at bedtime. 11/04/23   Brian Campanile, MD  Multiple Vitamin (MULTIVITAMIN) tablet Take 2 tablets by mouth daily.    [provider]      Allergies    Codeine    Review of Systems   Review of Systems  Constitutional:  Negative for activity change, appetite change, fatigue and fever.  HENT:  Positive for sore throat and trouble swallowing. Negative for congestion.   Eyes:  Negative for visual disturbance.  Respiratory:  Negative for cough, chest tightness and shortness of breath.   Cardiovascular:  Negative for chest pain.  Gastrointestinal:  Negative for abdominal pain, nausea and vomiting.  Genitourinary:  Negative for dysuria and hematuria.  Skin:  Positive for rash. Negative for pallor.  Neurological:  Negative for weakness and headaches.    all other systems are negative except as noted in the HPI and PMH.   Physical Exam Updated Vital Signs BP 125/80   Pulse (!) 118   Resp 20   SpO2 100%  Physical Exam Vitals and nursing note reviewed.  Constitutional:      General: She is not in acute distress.    Appearance: She is well-developed.  HENT:     Head: Normocephalic and atraumatic.     Mouth/Throat:     Pharynx: No  oropharyngeal exudate.     Comments: Swelling of lower lip.  Floor mouth is soft.  No tongue swelling.  Posterior pharynx is clear.  Controlling secretions.  No stridor Eyes:     Conjunctiva/sclera: Conjunctivae normal.     Pupils: Pupils are equal, round, and reactive to light.  Neck:     Comments: No meningismus. Cardiovascular:     Rate and Rhythm: Regular rhythm. Tachycardia present.     Heart sounds: Normal heart sounds. No murmur heard. Pulmonary:     Effort: Pulmonary effort is normal. No respiratory distress.     Breath sounds: Normal breath sounds.  Abdominal:     Palpations:  Abdomen is soft.     Tenderness: There is no abdominal tenderness. There is no guarding or rebound.  Musculoskeletal:        General: No tenderness. Normal range of motion.     Cervical back: Normal range of motion and neck supple.  Skin:    General: Skin is warm.     Findings: Rash present.     Comments: Scattered urticaria involving chest, back and arms and legs  Neurological:     Mental Status: She is alert and oriented to person, place, and time.     Cranial Nerves: No cranial nerve deficit.     Motor: No abnormal muscle tone.     Coordination: Coordination normal.     Comments:  5/5 strength throughout. CN 2-12 intact.Equal grip strength.   Psychiatric:        Behavior: Behavior normal.     ED Results / Procedures / Treatments   Labs (all labs ordered are listed, but only abnormal results are displayed) Labs Reviewed - No data to display   EKG EKG Interpretation Date/Time:  Tuesday Jan 10 2024 06:30:17 EDT Ventricular Rate:  98 PR Interval:  143 QRS Duration:  85 QT Interval:  345 QTC Calculation: 441 R Axis:   35  Text Interpretation: Sinus rhythm Low voltage, precordial leads Borderline abnrm T, anterolateral leads Baseline wander in lead(s) II aVF No previous ECGs available Confirmed by Earma Gloss [phone]) on 01/10/2024 6:36:19 AM  Radiology No results found.  Procedures .Critical Care  Performed by: Earma Gloss, MD Authorized by: Earma Gloss, MD   Critical care provider statement:    Critical care time (minutes):  45   Critical care time was exclusive of:  Separately billable procedures and treating other patients   Critical care was necessary to treat or prevent imminent or life-threatening deterioration of the following conditions: angioedema.   Critical care was time spent personally by me on the following activities:  Development of treatment plan with patient or surrogate, discussions with consultants, evaluation of patient's response to  treatment, examination of patient, ordering and review of laboratory studies, ordering and review of radiographic studies, ordering and performing treatments and interventions, pulse oximetry, re-evaluation of patient's condition, review of old charts, blood draw for specimens and obtaining history from patient or surrogate   I assumed direction of critical care for this patient from another provider in my specialty: no     Care discussed with: admitting provider       Medications Ordered in ED Medications  EPINEPHrine  (EPI-PEN) 0.3 mg/0.3 mL injection (has no administration in time range)  sodium chloride  0.9 % bolus 1,000 mL (has no administration in time range)    And  0.9 %  sodium chloride  infusion (has no administration in time range)  diphenhydrAMINE  (BENADRYL ) injection 25 mg (has no  administration in time range)  EPINEPHrine  (EPI-PEN) injection 0.3 mg (has no administration in time range)  ondansetron  (ZOFRAN ) injection 4 mg (has no administration in time range)  methylPREDNISolone  sodium succinate (SOLU-MEDROL ) 125 mg/2 mL injection 125 mg (has no administration in time range)  famotidine  (PEPCID ) IVPB 20 mg premix (has no administration in time range)    ED Course/ Medical Decision Making/ A&P                                 Medical Decision Making Amount and/or Complexity of Data Reviewed Labs: ordered. Decision-making details documented in ED Course. Radiology: ordered and independent interpretation performed. Decision-making details documented in ED Course. ECG/medicine tests: ordered and independent interpretation performed. Decision-making details documented in ED Course.  Risk Prescription drug management.   Presumed allergic reaction and angioedema with history of chronic urticaria.  Given her tongue swelling and lip swelling, IM epinephrine  is initiated as well as IV steroids, Pepcid , Benadryl  and IV fluids.  No ACE inhibitor use  Patient given IM epinephrine ,  steroids, Pepcid  and Benadryl .  She denies any chest pain or shortness of breath.  She has no drooling or trismus.  Angioedema of uncertain etiology.  Will monitor closely after epinephrine  injection.  Patient monitored for 3+ hours after epinephrine  injection without further deterioration.  Lip swelling has improved but is not gone.  Tongue and throat feel normal.  Patient controlling secretions no trismus or stridor.  Still describes some discomfort in her throat but much improved.  Offered CT scan of neck but patient declines feels like her throat is much better.  Denies any pain with swallowing or pain with breathing.  Her tongue feels normal.  Her lip is still slightly swollen but improved.  No evidence of posterior pharyngeal swelling on exam.  She has an appointment with her allergist later today.  Uncertain what is causing her angioedema. Continue Her antihistamine regimen and will add prednisone .  She has an epinephrine  pen with instructions for use. She knows that she needs to come to the ED afterwards if she uses the epi pen.  Heart rate improved to 100s.  No tongue swelling.  Tolerating p.o. without difficulty.  Followup with PCP and allergist. Return to the ED with new or worsening symptoms.        Final Clinical Impression(s) / ED Diagnoses Final diagnoses:  Angioedema, initial encounter  Anaphylaxis, initial encounter    Rx / DC Orders ED Discharge Orders     None         Sincere Berlanga, Mara [place], MD 01/10/24 [phone]

## 2024-01-10 NOTE — ED Triage Notes (Signed)
 Pt reports waking up and having hives & feeling like throat is swelling. Pt reports she has been seeing an allergist for hives but it hasn't been this bad.

## 2024-01-10 NOTE — Discharge Instructions (Signed)
 Take the steroids as prescribed as well as the rest of your antihistamine regimen.  Use the epinephrine  pen only for severe allergic reaction with difficulty breathing, difficulty swallowing, tongue swelling, lip swelling.  If you use the epinephrine  pen you must come to the hospital afterwards to be observed. Follow-up with your allergist.  Return to the ED with new or worsening symptoms.

## 2024-01-10 NOTE — Progress Notes (Signed)
 [address] [place] [address] Dept: [phone]  FOLLOW UP NOTE  Patient ID: Joanna Cross, female    DOB: 1976-02-05  Age: 48 y.o. MRN: [id number] Date of Office Visit: 01/10/2024  Assessment  Chief Complaint: Angioedema (The patient developed facial edema, lip swelling, and throat edema, suggested allergic anaphylactic reaction. She was seen and treat at the ED last night/ early morning. ), Urticaria (Hives have gotten increasingly worse in the last two weeks. Medications are not working.   Did not feel well last night and rushed herself to the ED, Received a Epipen  at 2:30 this morning, benadryl , Pepcid , and soul medrol.  EKG was also done. /), and Breathing Problem   HPI EDIN SKARDA is a 48 year old female who presents today for an acute visit of hives.  She was last seen on November 11, 2023 by Dr. Tempie Fee for chronic urticaria.  She denies any new diagnosis or surgery since her last office visit.  She reports that she continues to have hives.  She received her second Xolair  injection on Dec 22, 2023 and is due for her next Xolair  injection on June 5.  She mentions that the hives have never went away.  She has been very flared.  2 weeks ago her primary care physician put her on prednisone due to having hives all over and swelling of the feet, neck, and arms.  Her primary care physician referred her to see Dr.Chen with [facility] on the 19th of this month.  She ended up having a biopsy and does not have those results yet.  Last Wednesday was her last day on prednisone that was prescribed by her primary care physician.  Yesterday her hives were flared all over her face.  Then she woke up around 130 this morning and her bottom lip was swollen twice the size and it was hard to swallow.  She wondered if her throat was closing up.  She did not take any medicine or use her epinephrine  autoinjector device.  She went to the emergency room where she reports that they attempted to give  her an IV 3 times but were not able to.  She was given epinephrine  injection, and Benadryl , Pepcid , and Solu-Medrol by mouth.  She reports that she is breathing fine now and the swelling has gone down on her lower lip.  She reports this is the first time that she has had swelling of the lip or felt difficulty breathing.  She reports that the dermatologist currently has her on Zyrtec  1 tablet 3 times a day, Pepcid  twice a day, Singulair  10 mg at night, and Benadryl  1 tablet at night.  She does not feel like this is helping.  Prednisone is the only thing that will give her any relief.  She denies any new medications, new foods, new products, and no one else in the household has hives.  She denies also joint pain or fever, but mentions while she was in the emergency room she had a temperature of 99.5 that went down on its own to 98.5.  She also had some chills.  She mentions wherever she is swollen will be painful.  She denies any recent bug bites.  She has little bit of bruising at sites where she is scratched.  She denies any family history of swelling.   Drug Allergies:  Allergies  Allergen Reactions   Codeine Hives    Review of Systems: Negative except as per HPI  Physical Exam: BP 132/78 (  BP Location: Right Arm, Patient Position: Sitting, Cuff Size: Normal)   Pulse (!) 105   Temp 98 F (36.7 C) (Temporal)   Resp (!) 22   Ht 5\' 7"  (1.702 m)   Wt (!) 305 lb 8 oz (138.6 kg)   SpO2 97%   BMI 47.85 kg/m    Physical Exam Constitutional:      Appearance: Normal appearance.     Comments: Able to speak in full sentences  HENT:     Head: Normocephalic and atraumatic.     Comments: Pharynx normal, eyes normal, ears normal, nose normal    Right Ear: Tympanic membrane, ear canal and external ear normal.     Left Ear: Tympanic membrane, ear canal and external ear normal.     Nose: Nose normal.     Mouth/Throat:     Mouth: Mucous membranes are moist.     Pharynx: Oropharynx is clear.  Eyes:      Conjunctiva/sclera: Conjunctivae normal.  Cardiovascular:     Rate and Rhythm: Regular rhythm.     Heart sounds: Normal heart sounds.  Pulmonary:     Effort: Pulmonary effort is normal.     Breath sounds: Normal breath sounds.     Comments: Lungs clear to auscultation Musculoskeletal:     Cervical back: Neck supple.  Skin:    General: Skin is warm.     Comments: Few urticarial lesions noted on left side of face near jawline.  Swelling noted on bottom lip  Neurological:     Mental Status: She is alert and oriented to person, place, and time.  Psychiatric:        Mood and Affect: Mood normal.        Behavior: Behavior normal.        Thought Content: Thought content normal.        Judgment: Judgment normal.     Diagnostics:  None  Assessment and Plan: 1. Idiopathic urticaria   2. Angioedema, initial encounter     No orders of the defined types were placed in this encounter.   Patient Instructions  Urticaria (Hives), chronic and angioedema Not improving with current antihistamine regimen and 2nd Xolair  injeciton -Start prednisone as per the emergency room -Continue Zyrtec  10mg  1 tab 3 times a day.  Max dosing of Zyrtec  is 4 tabs a day.  -Continue Pepcid  20mg  1 tab twice daily for more complete histamine blockade. -Continue Singulair  10mg  daily at bedtime/evening. This is an antileukotriene that can help with both allergy and asthma symptom control.  If you notice any change in mood/behavior/sleep after starting Singulair  then stop this medication and let us  know.  Symptoms resolve after stopping the medication.  -Continue Xolair  300 mg monthly injections and have access to your epinephrine  autoinjector device at all times. Consider changing to every 2 weeks if continues to have hives after 3rd injection.  Reviewed how to use epinephrine  autoinjector device -Hive labwork was largely unremarkable but did show positive allergens to dog and cat dander as well as elevated  inflammatory markers. - We we will get lab work to check for causes of your swelling.  We will call you with results when they come back - If your symptoms re-occur, begin a journal of events that occurred for up to 6 hours before your symptoms began including foods and beverages consumed, soaps or perfumes you had contact with, and medications.    Follow-up in 4 weeks or sooner if needed Return in about 4 weeks (around  02/07/2024), or if symptoms worsen or fail to improve.    Thank you for the opportunity to care for this patient.  Please do not hesitate to contact me with questions.  Tinnie Forehand, FNP [facility] 

## 2024-01-13 ENCOUNTER — Ambulatory Visit: Payer: 59 | Admitting: Allergy

## 2024-01-13 ENCOUNTER — Ambulatory Visit: Payer: Self-pay | Admitting: Allergy

## 2024-01-13 DIAGNOSIS — J309 Allergic rhinitis, unspecified: Secondary | ICD-10-CM

## 2024-01-19 ENCOUNTER — Ambulatory Visit (INDEPENDENT_AMBULATORY_CARE_PROVIDER_SITE_OTHER): Payer: Self-pay

## 2024-01-19 DIAGNOSIS — L501 Idiopathic urticaria: Secondary | ICD-10-CM | POA: Diagnosis not present

## 2024-01-21 LAB — COMPLEMENT COMPONENT C1Q: Complement C1Q: 19.8 mg/dL (ref 10.3–20.5)

## 2024-01-21 LAB — C1 ESTERASE INHIBITOR: C1INH SerPl-mCnc: 47 mg/dL — ABNORMAL HIGH (ref 21–39)

## 2024-01-21 LAB — C1 ESTERASE INHIBITOR, FUNCTIONAL: C1INH Functional/C1INH Total MFr SerPl: >105 %{normal}

## 2024-01-21 LAB — C4 COMPLEMENT: Complement C4, Serum: 28 mg/dL (ref 12–38)

## 2024-01-23 ENCOUNTER — Ambulatory Visit: Payer: Self-pay | Admitting: Family

## 2024-01-23 NOTE — Progress Notes (Signed)
 Please let Joanna Cross know that her lab work for angioedema was not revealing.

## 2024-01-27 ENCOUNTER — Emergency Department (HOSPITAL_BASED_OUTPATIENT_CLINIC_OR_DEPARTMENT_OTHER)
Admission: EM | Admit: 2024-01-27 | Discharge: 2024-01-27 | Disposition: A | Discharge status: Home / Self Care | Attending: Emergency Medicine | Admitting: Emergency Medicine

## 2024-01-27 ENCOUNTER — Encounter (HOSPITAL_BASED_OUTPATIENT_CLINIC_OR_DEPARTMENT_OTHER): Payer: Self-pay | Admitting: *Deleted

## 2024-01-27 ENCOUNTER — Ambulatory Visit: Admitting: Allergy

## 2024-01-27 ENCOUNTER — Other Ambulatory Visit: Payer: Self-pay

## 2024-01-27 DIAGNOSIS — T7840XA Allergy, unspecified, initial encounter: Secondary | ICD-10-CM | POA: Insufficient documentation

## 2024-01-27 MED ORDER — SODIUM CHLORIDE 0.9 % IV SOLN
INTRAVENOUS | Status: DC | PRN
  Administered 2024-01-27: 100 mL via INTRAVENOUS

## 2024-01-27 MED ORDER — PREDNISONE 20 MG PO TABS: ORAL_TABLET | ORAL | 0 refills | Status: DC

## 2024-01-27 MED ORDER — DIPHENHYDRAMINE HCL 50 MG/ML IJ SOLN
25.0000 mg | Freq: Once | INTRAMUSCULAR | Status: AC
  Administered 2024-01-27: 25 mg via INTRAVENOUS
  Filled 2024-01-27: qty 1

## 2024-01-27 MED ORDER — FAMOTIDINE IN NACL 20-0.9 MG/50ML-% IV SOLN
20.0000 mg | Freq: Once | INTRAVENOUS | Status: AC
  Administered 2024-01-27: 20 mg via INTRAVENOUS
  Filled 2024-01-27: qty 50

## 2024-01-27 MED ORDER — EPINEPHRINE 0.3 MG/0.3ML IJ SOAJ: 0.3000 mg | INTRAMUSCULAR | 0 refills | Status: DC | PRN

## 2024-01-27 MED ORDER — METHYLPREDNISOLONE SODIUM SUCC 125 MG IJ SOLR
125.0000 mg | INTRAMUSCULAR | Status: AC
  Administered 2024-01-27: 125 mg via INTRAVENOUS
  Filled 2024-01-27: qty 2

## 2024-01-27 NOTE — Discharge Instructions (Addendum)
 I have prescribed you steroids for the next few days.  Please take antihistamines regularly as well.  Call your allergist and let them know about your visit today.  They may want to change your visit or may recommend timing for your steroids and antihistamines.  Please return to the Emergency Department if you feel like you are having trouble breathing or swallowing or if you start throwing up or feeling like you might pass out.  If you need to use your EpiPen  and use it and then please come to emergency department setting to be evaluated.

## 2024-01-27 NOTE — ED Notes (Signed)
 ED Provider at bedside.

## 2024-01-27 NOTE — ED Provider Notes (Signed)
 [place] EMERGENCY DEPARTMENT AT [facility] [place] Provider Note   CSN: [id number] Arrival date & time: 01/27/24  4098     Patient presents with: Allergic Reaction   Joanna Cross is a 48 y.o. female.   The history is provided by the patient.  Allergic Reaction Presenting symptoms: rash   Presenting symptoms: no wheezing   Severity:  Moderate Duration:  1 hour Prior allergic episodes:  Unable to specify (has chronic idiopathic hives) Context: not insect bite/sting   Relieved by:  Nothing Worsened by:  Nothing Ineffective treatments: takes zyrtec  tid, benadryl  at night, Xolair  and pepcid . Used an epi pen at 455 am for symptoms.       Prior to Admission medications   Medication Sig Start Date End Date Taking? Authorizing Provider  acetaminophen  (TYLENOL ) 500 MG tablet Take 1 tablet (500 mg total) by mouth every 6 (six) hours as needed. 10/05/17   Law, Alexandra M, PA-C  cetirizine  (ZYRTEC  ALLERGY) 10 MG tablet Take 1 tablet (10 mg total) by mouth 2 (two) times daily. 09/30/23   Brian Campanile, MD  cetirizine -pseudoephedrine  (ZYRTEC -D) 5-120 MG tablet Take 1 tablet by mouth daily. 10/05/17   Law, Alexandra M, PA-C  EPINEPHrine  0.3 mg/0.3 mL IJ SOAJ injection Inject 0.3 mg into the muscle as needed for anaphylaxis. 12/22/23   Brian Campanile, MD  famotidine  (PEPCID ) 20 MG tablet Take 1 tablet (20 mg total) by mouth 2 (two) times daily. 09/30/23   Brian Campanile, MD  hydrOXYzine (ATARAX) 10 MG tablet Take 10 mg by mouth 3 (three) times daily as needed for itching. 09/27/23   [provider]  ibuprofen  (ADVIL ,MOTRIN ) 600 MG tablet Take 1 tablet (600 mg total) by mouth every 6 (six) hours as needed. 10/05/17   Law, Alexandra M, PA-C  montelukast  (SINGULAIR ) 10 MG tablet Take 1 tablet (10 mg total) by mouth at bedtime. 11/04/23   Brian Campanile, MD  Multiple Vitamin (MULTIVITAMIN) tablet Take 2 tablets by mouth daily.    [provider]  predniSONE  (DELTASONE ) 50 MG tablet 1 tablet PO daily 01/10/24   Rancour, Mara [place]in[place], MD    Allergies: Codeine    Review of Systems  Respiratory:  Negative for wheezing and stridor.   Skin:  Positive for rash.  All other systems reviewed and are negative.   Updated Vital Signs There were no vitals taken for this visit.  Physical Exam Vitals and nursing note reviewed.  Constitutional:      General: She is not in acute distress.    Appearance: She is well-developed.  HENT:     Head: Normocephalic and atraumatic.     Nose: Nose normal.     Mouth/Throat:     Comments: Intact phonation.  No swelling of the tongue uvula or floor of the mouth   Eyes:     Pupils: Pupils are equal, round, and reactive to light.    Cardiovascular:     Rate and Rhythm: Normal rate and regular rhythm.     Pulses: Normal pulses.     Heart sounds: Normal heart sounds.  Pulmonary:     Effort: Pulmonary effort is normal. No respiratory distress.     Breath sounds: Normal breath sounds. No stridor.  Abdominal:     General: Bowel sounds are normal. There is no distension.     Palpations: Abdomen is soft.     Tenderness: There is no abdominal tenderness. There is no guarding or rebound.   Musculoskeletal:  General: Normal range of motion.     Cervical back: Normal range of motion and neck supple.   Skin:    General: Skin is warm and dry.     Capillary Refill: Capillary refill takes less than 2 seconds.     Findings: No erythema or rash.   Neurological:     General: No focal deficit present.     Deep Tendon Reflexes: Reflexes normal.   Psychiatric:        Mood and Affect: Mood normal.     (all labs ordered are listed, but only abnormal results are displayed) Labs Reviewed - No data to display  EKG: None  Radiology: No results found.   Procedures   Medications Ordered in the ED  methylPREDNISolone  sodium succinate (SOLU-MEDROL ) 125 mg/2 mL injection 125 mg (has no  administration in time range)  famotidine  (PEPCID ) IVPB 20 mg premix (has no administration in time range)  diphenhydrAMINE  (BENADRYL ) injection 25 mg (has no administration in time range)                                    Medical Decision Making Patient with history of urticaria who has seen dermatology and allergy and is on medications presents with allergic reaction   Amount and/or Complexity of Data Reviewed External Data Reviewed: notes.    Details: Previous notes reviewed   Risk Prescription drug management. Risk Details: Well appearing.  Being observed in the ED following use of epi pen      Final diagnoses:  Allergic reaction, initial encounter   Signed out to Dr. Inga Manges pending observation period of 4 hours ED Discharge Orders     None          Sivan Cuello, MD 01/27/24 1610

## 2024-01-27 NOTE — ED Provider Notes (Signed)
 Received patient in turnover from Dr. Palumbo.  Please see their note for further details of Hx, PE.  Briefly patient is a 48 y.o. female with a Allergic Reaction .  Patient self administered epi at home.  Plan to watch for 4 hours post.   I reassessed the patient.  She has been much better and would like to go home.  I discussed rationale for 4-hour obs.  She would like to go home at this time.  She has an appoint with her allergist in a week.  Will prescribe a course of steroids.    Albertus Hughs, DO 01/27/24 [phone]

## 2024-01-27 NOTE — ED Triage Notes (Addendum)
 Pt present with allergic reaction. Pt is anxious. Sts she used Epi pen this AM 0455. Has heaves on face, arms, legs bilaterally. Also increased itching.  Denies Shortness of breath or pain. Pt took scheduled allergy preventative medication has prescribed. Pt is Being followed by Dr Landry Pinks- Allergist. Last xolair  injection June 5th  . Scheduled to see allergist Jun 19th.

## 2024-02-02 ENCOUNTER — Other Ambulatory Visit: Payer: Self-pay

## 2024-02-02 ENCOUNTER — Other Ambulatory Visit: Payer: Self-pay | Admitting: Allergy

## 2024-02-02 ENCOUNTER — Encounter: Payer: Self-pay | Admitting: Allergy

## 2024-02-02 ENCOUNTER — Ambulatory Visit (INDEPENDENT_AMBULATORY_CARE_PROVIDER_SITE_OTHER): Admitting: Allergy

## 2024-02-02 VITALS — BP 118/84 | HR 99 | Temp 98.0°F | Resp 20

## 2024-02-02 DIAGNOSIS — L501 Idiopathic urticaria: Secondary | ICD-10-CM | POA: Diagnosis not present

## 2024-02-02 MED ORDER — EPINEPHRINE 0.3 MG/0.3ML IJ SOAJ: 0.3000 mg | INTRAMUSCULAR | 1 refills | Status: AC | PRN

## 2024-02-02 NOTE — Progress Notes (Signed)
 Follow-up Note  RE: Joanna Cross MRN: [id number] DOB: 1976/05/09 Date of Office Visit: 02/02/2024   History of present illness: Joanna Cross is a 48 y.o. female presenting today for follow-up of urticaria.  She was last seen in the office on 01/10/2024 by nurse practitioner Eddie Good. Discussed the use of AI scribe software for clinical note transcription with the patient, who gave verbal consent to proceed.  She has been experiencing chronic hives and has received three doses of Xolair  thus far last on 01/19/2024. She had two days without hives this week. However, she has had two emergency room visits due to severe flare-ups and has been on prednisone  weekly for the past three to four weeks. Her last dose of prednisone  was two days ago, and she has not experienced itching since then.  She is currently taking multiple medications to manage her symptoms, including Zyrtec  three times a day, Pepcid  twice a day, Singulair  daily, and Benadryl  at night. She feels 'slammed out' by the medications, particularly Benadryl , which causes drowsiness. She has tried not taking Benadryl  and the third Zyrtec  dose but experienced significant swelling the following morning.  Her hives are severe, causing significant swelling and hives and itchiness impacting her daily life, including missing work due to skin irritation and mood changes from prednisone . She is frustrated with the ongoing symptoms and the impact on her quality of life.  She mentions having a family reunion this weekend but is hesitant to attend due to concerns about heat triggering her hives.     She did have a skin biopsy from 01/05/2024 a hive lesion that was negative for direct immunofluorescence studies.   Review of systems: 10pt ROS negative unless noted above in HPI  Past medical/social/surgical/family history have been reviewed and are unchanged unless specifically indicated below.  No changes  Medication List: Current Outpatient  Medications  Medication Sig Dispense Refill   acetaminophen  (TYLENOL ) 500 MG tablet Take 1 tablet (500 mg total) by mouth every 6 (six) hours as needed. 30 tablet 0   cetirizine  (ZYRTEC  ALLERGY) 10 MG tablet Take 1 tablet (10 mg total) by mouth 2 (two) times daily. 60 tablet 2   cetirizine -pseudoephedrine  (ZYRTEC -D) 5-120 MG tablet Take 1 tablet by mouth daily. 30 tablet 0   EPINEPHrine  0.3 mg/0.3 mL IJ SOAJ injection Inject 0.3 mg into the muscle as needed for anaphylaxis. 1 each 0   famotidine  (PEPCID ) 20 MG tablet Take 1 tablet (20 mg total) by mouth 2 (two) times daily. 60 tablet 2   ibuprofen  (ADVIL ,MOTRIN ) 600 MG tablet Take 1 tablet (600 mg total) by mouth every 6 (six) hours as needed. 30 tablet 0   montelukast  (SINGULAIR ) 10 MG tablet Take 1 tablet (10 mg total) by mouth at bedtime. 30 tablet 5   Multiple Vitamin (MULTIVITAMIN) tablet Take 2 tablets by mouth daily.     sertraline  (ZOLOFT ) 100 MG tablet Take 100 mg by mouth daily.     predniSONE  (DELTASONE ) 20 MG tablet 3 tabs po day one, then 2 po daily x 4 days (Patient not taking: Reported on 02/02/2024) 11 tablet 0   Current Facility-Administered Medications  Medication Dose Route Frequency Provider Last Rate Last Admin   omalizumab  (XOLAIR ) prefilled syringe 300 mg  300 mg Subcutaneous Q28 days Brian Campanile, MD   300 mg at 01/19/24 8657     Known medication allergies: Allergies  Allergen Reactions   Codeine Hives     Physical examination: Blood pressure 118/84, pulse  99, temperature 98 F (36.7 C), resp. rate 20, last menstrual period 01/23/2024, SpO2 99%.  General: Alert, interactive, in no acute distress. HEENT: PERRLA, TMs pearly gray, turbinates non-edematous without discharge, post-pharynx non erythematous. Neck: Supple without lymphadenopathy. Lungs: Clear to auscultation without wheezing, rhonchi or rales. {no increased work of breathing. CV: Normal S1, S2 without murmurs. Abdomen: Nondistended,  nontender. Skin: Warm and dry, without lesions or rashes. Extremities:  No clubbing, cyanosis or edema. Neuro:   Grossly intact.  Diagnostics/Labs: Labs:  Component     Latest Ref Rng 01/10/2024  Complement C1Q     10.3 - 20.5 mg/dL 16.1   Complement C4, Serum     12 - 38 mg/dL 28   W9UEA SerPl-mCnc     21 - 39 mg/dL 47 (H)   [id number] Total MFr SerPl     %mean normal >105    Assessment and plan: Urticaria (Hives), chronic with angioedema Continued hives and swelling episodes with 2 ED visits with prednisone  use since last visit.  Now with 3 doses of Xolair  done.  Today is day 2 of no hives/itch/swelling off prednisone .   Discussed today between doses 2-4 of Xolair  is where we usually start to see improvements in hive symptoms so you are in that time frame.    -If Xolair  does not appear to be working well then options are  1) double Xolair  dose by doing every 2 week injections 2) continue Xolair  monthly dosing but adding in Cyclosporine  3) changing Xolair  to Dupixent injections which is now approved for hives 4) taking Cyclosporine daily alone -Continue Zyrtec  10mg  1 tab 3-4 times a day.  Max dosing of Zyrtec  is 4 tabs a day.  -Continue Pepcid  20mg  1 tab twice daily for more complete histamine blockade. -Continue Singulair  10mg  daily at bedtime/evening. This is an antileukotriene that can help with both allergy and asthma symptom control.  If you notice any change in mood/behavior/sleep after starting Singulair  then stop this medication and let us  know.  Symptoms resolve after stopping the medication.  -Continue Xolair  300 mg monthly injections for now and have access to your epinephrine  autoinjector device at all times.    Follow-up in 6-8 weeks or sooner if needed  I appreciate the opportunity to take part in Thia's care. Please do not hesitate to contact me with questions.  Sincerely,   Catha Clink, MD Allergy/Immunology [facility]

## 2024-02-02 NOTE — Patient Instructions (Addendum)
 Urticaria (Hives), chronic with angioedema Continued hives and swelling episodes with 2 ED visits with prednisone  use since last visit.  Now with 3 doses of Xolair  done.  Today is day 2 of no hives/itch/swelling off prednisone .   Discussed today between doses 2-4 of Xolair  is where we usually start to see improvements in hive symptoms so you are in that time frame.    -If Xolair  does not appear to be working well then options are  1) double Xolair  dose by doing every 2 week injections 2) continue Xolair  monthly dosing but adding in Cyclosporine  3) changing Xolair  to Dupixent injections which is now approved for hives 4) taking Cyclosporine daily alone -Continue Zyrtec  10mg  1 tab 3-4 times a day.  Max dosing of Zyrtec  is 4 tabs a day.  -Continue Pepcid  20mg  1 tab twice daily for more complete histamine blockade. -Continue Singulair  10mg  daily at bedtime/evening. This is an antileukotriene that can help with both allergy and asthma symptom control.  If you notice any change in mood/behavior/sleep after starting Singulair  then stop this medication and let us  know.  Symptoms resolve after stopping the medication.  -Continue Xolair  300 mg monthly injections for now and have access to your epinephrine  autoinjector device at all times.    Follow-up in 6-8 weeks or sooner if needed

## 2024-02-03 ENCOUNTER — Ambulatory Visit: Admitting: Allergy

## 2024-02-09 ENCOUNTER — Encounter: Payer: Self-pay | Admitting: Allergy

## 2024-02-14 ENCOUNTER — Other Ambulatory Visit: Payer: Self-pay | Admitting: *Deleted

## 2024-02-14 MED ORDER — XOLAIR 300 MG/2ML ~~LOC~~ SOSY: 300.0000 mg | PREFILLED_SYRINGE | SUBCUTANEOUS | 11 refills | Status: AC

## 2024-02-14 NOTE — Telephone Encounter (Signed)
 Spoke to patient and advised increase in dosing for XOlair  to 300mg  every 14 days. Due to dispense for injection this week will not be able to get shipped until Aug since Ins pays for 28 days supply

## 2024-02-16 ENCOUNTER — Ambulatory Visit

## 2024-02-16 DIAGNOSIS — L501 Idiopathic urticaria: Secondary | ICD-10-CM

## 2024-02-27 ENCOUNTER — Other Ambulatory Visit: Payer: Self-pay | Admitting: *Deleted

## 2024-02-27 MED ORDER — CETIRIZINE HCL 10 MG PO TABS: 10.0000 mg | ORAL_TABLET | Freq: Two times a day (BID) | ORAL | 5 refills | Status: DC

## 2024-03-15 ENCOUNTER — Ambulatory Visit (INDEPENDENT_AMBULATORY_CARE_PROVIDER_SITE_OTHER)

## 2024-03-15 DIAGNOSIS — L501 Idiopathic urticaria: Secondary | ICD-10-CM | POA: Diagnosis not present

## 2024-04-05 ENCOUNTER — Ambulatory Visit: Admitting: Allergy

## 2024-04-12 ENCOUNTER — Ambulatory Visit (INDEPENDENT_AMBULATORY_CARE_PROVIDER_SITE_OTHER)

## 2024-04-12 DIAGNOSIS — L501 Idiopathic urticaria: Secondary | ICD-10-CM | POA: Diagnosis not present

## 2024-05-10 ENCOUNTER — Ambulatory Visit

## 2024-05-10 DIAGNOSIS — L501 Idiopathic urticaria: Secondary | ICD-10-CM | POA: Diagnosis not present

## 2024-05-31 ENCOUNTER — Ambulatory Visit: Admitting: Allergy

## 2024-05-31 ENCOUNTER — Other Ambulatory Visit: Payer: Self-pay

## 2024-05-31 ENCOUNTER — Encounter: Payer: Self-pay | Admitting: Allergy

## 2024-05-31 VITALS — BP 112/70 | HR 85 | Temp 97.3°F | Resp 19

## 2024-05-31 DIAGNOSIS — L501 Idiopathic urticaria: Secondary | ICD-10-CM

## 2024-05-31 NOTE — Patient Instructions (Addendum)
 Urticaria (Hives), chronic with angioedema - controlled At this time hives are under control with use of Xolair .   -Continue Xolair  300mg  monthly injections at this time and have access to your epinephrine  device.   -Discussed weaning down oral medications as follows:  -stop Pepcid .  Continue Zyrtec  1 tab and Singulair  1 tab daily.   If hive free after 7 days then...  -stop Singulair .  Continue Zyrtec  1 tab daily.  If hive free after 7 days then..  -stop Zyrtec .    If at anytime you note more itching or hives then resume last dose taken away and continue.   -Once off all oral medications and doing well on Xolair  monthly then will discuss option of weaning Xolair  if able.     Follow-up in 6 months or sooner if needed

## 2024-05-31 NOTE — Progress Notes (Signed)
 Follow-up Note  RE: Joanna Cross MRN: [id number] DOB: 12/16/1975 Date of Office Visit: 05/31/2024   History of present illness: Joanna Cross is a 47 y.o. female presenting today for follow-up of chronic urticaria.  She was last seen in the office on 02/02/24 by myself.   Discussed the use of AI scribe software for clinical note transcription with the patient, who gave verbal consent to proceed.  She has a history of chronic spontaneous urticaria, which was particularly problematic in June with significant issues with hives. Since then, she has been on a regimen of Xolair , administered once a month at the clinic, which has effectively controlled her symptoms.  She has access to her epinephrine  device while on Xolair .  She has not experienced any hives since June, although she occasionally wakes up with an itchy leg but does not see any hives.  Her current medication regimen includes Zyrtec , Pepcid , and Singulair , each taken once daily which she did reduce to this two weeks ago. Allergy testing indicated sensitivity to cat and dog allergens only. She does not have any known food allergies or other environmental triggers that could explain her urticaria.     Review of systems: 10pt ROS negative unless noted above in HPI  Past medical/social/surgical/family history have been reviewed and are unchanged unless specifically indicated below.  No changes  Medication List: Current Outpatient Medications  Medication Sig Dispense Refill   acetaminophen  (TYLENOL ) 500 MG tablet Take 1 tablet (500 mg total) by mouth every 6 (six) hours as needed. 30 tablet 0   cetirizine  (ZYRTEC  ALLERGY) 10 MG tablet Take 1 tablet (10 mg total) by mouth 2 (two) times daily. 60 tablet 5   cetirizine -pseudoephedrine  (ZYRTEC -D) 5-120 MG tablet Take 1 tablet by mouth daily. 30 tablet 0   EPINEPHrine  0.3 mg/0.3 mL IJ SOAJ injection Inject 0.3 mg into the muscle as needed for anaphylaxis. 1 each 1   famotidine   (PEPCID ) 20 MG tablet TAKE 1 TABLET BY MOUTH TWICE A DAY 180 tablet 5   ibuprofen  (ADVIL ,MOTRIN ) 600 MG tablet Take 1 tablet (600 mg total) by mouth every 6 (six) hours as needed. 30 tablet 0   montelukast  (SINGULAIR ) 10 MG tablet Take 1 tablet (10 mg total) by mouth at bedtime. 30 tablet 5   Multiple Vitamin (MULTIVITAMIN) tablet Take 2 tablets by mouth daily.     omalizumab  (XOLAIR ) 300 MG/2  ML prefilled syringe Inject 300 mg into the skin every 14 (fourteen) days. 4 mL 11   sertraline  (ZOLOFT ) 100 MG tablet Take 100 mg by mouth daily.     tirzepatide (MOUNJARO) 10 MG/0.5ML Pen Inject 10 mg into the skin once a week.     predniSONE  (DELTASONE ) 20 MG tablet 3 tabs po day one, then 2 po daily x 4 days (Patient not taking: Reported on 02/02/2024) 11 tablet 0   Current Facility-Administered Medications  Medication Dose Route Frequency Provider Last Rate Last Admin   omalizumab  (XOLAIR ) prefilled syringe 300 mg  300 mg Subcutaneous Q28 days Jeneal Danita Macintosh, MD   300 mg at 05/10/24 9140     Known medication allergies: Allergies  Allergen Reactions   Codeine Hives     Physical examination: Blood pressure 112/70, pulse 85, temperature (!) 97.3 F (36.3 C), resp. rate 19, SpO2 97%.  General: Alert, interactive, in no acute distress. HEENT: PERRLA, TMs pearly gray, turbinates non-edematous without discharge, post-pharynx non erythematous. Neck: Supple without lymphadenopathy. Lungs: Clear to auscultation without wheezing, rhonchi or rales. {  no increased work of breathing. CV: Normal S1, S2 without murmurs. Abdomen: Nondistended, nontender. Skin: Warm and dry, without lesions or rashes. Extremities:  No clubbing, cyanosis or edema. Neuro:   Grossly intact.  Diagnostics/Labs: None today  Assessment and plan: Urticaria (Hives), chronic with angioedema - controlled At this time hives are under control with use of Xolair .   -Continue Xolair  300mg  monthly injections at this time  and have access to your epinephrine  device.   -Discussed weaning down oral medications as follows:  -stop Pepcid .  Continue Zyrtec  1 tab and Singulair  1 tab daily.   If hive free after 7 days then...  -stop Singulair .  Continue Zyrtec  1 tab daily.  If hive free after 7 days then..  -stop Zyrtec .    If at anytime you note more itching or hives then resume last dose taken away and continue.   -Once off all oral medications and doing well on Xolair  monthly then will discuss option of weaning Xolair  if able.     Follow-up in 6 months or sooner if needed  I appreciate the opportunity to take part in Tremaine's care. Please do not hesitate to contact me with questions.  Sincerely,   Danita Brain, MD Allergy/Immunology [facility]

## 2024-06-07 ENCOUNTER — Ambulatory Visit (INDEPENDENT_AMBULATORY_CARE_PROVIDER_SITE_OTHER)

## 2024-06-07 DIAGNOSIS — L501 Idiopathic urticaria: Secondary | ICD-10-CM | POA: Diagnosis not present

## 2024-06-21 ENCOUNTER — Other Ambulatory Visit: Payer: Self-pay | Admitting: Physician Assistant

## 2024-06-21 DIAGNOSIS — Z1231 Encounter for screening mammogram for malignant neoplasm of breast: Secondary | ICD-10-CM

## 2024-06-28 ENCOUNTER — Encounter: Payer: Self-pay | Admitting: Emergency Medicine

## 2024-06-28 ENCOUNTER — Ambulatory Visit
Admission: EM | Admit: 2024-06-28 | Discharge: 2024-06-28 | Disposition: A | Discharge status: Home / Self Care | Attending: Student | Admitting: Student

## 2024-06-28 DIAGNOSIS — Z9851 Tubal ligation status: Secondary | ICD-10-CM

## 2024-06-28 DIAGNOSIS — K047 Periapical abscess without sinus: Secondary | ICD-10-CM | POA: Diagnosis not present

## 2024-06-28 MED ORDER — AMOXICILLIN-POT CLAVULANATE 875-125 MG PO TABS: 1.0000 | ORAL_TABLET | Freq: Two times a day (BID) | ORAL | 0 refills | Status: DC

## 2024-06-28 NOTE — Discharge Instructions (Addendum)
-  Start the antibiotic-Augmentin (amoxicillin-clavulanate), 1 pill every 12 hours for 7 days.  You can take this with food like with breakfast and dinner. -You can take Tylenol  up to 1000 mg 3 times daily. Avoid NSAIDs like ibuprofen  due to allergic urticaria. -Follow-up with dentist as scheduled on 07/03/24.

## 2024-06-28 NOTE — ED Triage Notes (Signed)
 Pt c/o dental abscess on left lower gum for about 2 days

## 2024-06-28 NOTE — ED Provider Notes (Signed)
 [facility]    CSN: [id number] Arrival date & time: 06/28/24  1107      History   Chief Complaint Chief Complaint  Patient presents with   Dental Pain    HPI Joanna Cross is a 48 y.o. female presenting with dental pain and abscess. Pt c/o dental abscess on left lower gum for about 2 days. Denies drainage. Pain is controlled with tylenol .  HPI  Past Medical History:  Diagnosis Date   Anxiety    Urticaria     Patient Active Problem List   Diagnosis Date Noted   Acute cholecystitis s/p lap cholecystectomy 04/13/2016 04/13/2016    Past Surgical History:  Procedure Laterality Date   CESAREAN SECTION     LAPAROSCOPIC CHOLECYSTECTOMY SINGLE SITE WITH INTRAOPERATIVE CHOLANGIOGRAM N/A 04/13/2016   Procedure: LAPAROSCOPIC CHOLECYSTECTOMY WITH SINGLE SITE with intraoperative cholangiogram;  Surgeon: Elspeth Schultze, MD;  Location: [facility];  Service: General;  Laterality: N/A;   TUBAL LIGATION      OB History   No obstetric history on file.      Home Medications    Prior to Admission medications   Medication Sig Start Date End Date Taking? Authorizing Provider  amoxicillin-clavulanate (AUGMENTIN) 875-125 MG tablet Take 1 tablet by mouth every 12 (twelve) hours. 06/28/24  Yes Angelli Baruch E, PA-C  acetaminophen  (TYLENOL ) 500 MG tablet Take 1 tablet (500 mg total) by mouth every 6 (six) hours as needed. 10/05/17   Law, Alexandra M, PA-C  cetirizine  (ZYRTEC  ALLERGY) 10 MG tablet Take 1 tablet (10 mg total) by mouth 2 (two) times daily. 02/27/24   Jeneal Danita Macintosh, MD  cetirizine -pseudoephedrine  (ZYRTEC -D) 5-120 MG tablet Take 1 tablet by mouth daily. 10/05/17   Law, Alexandra M, PA-C  EPINEPHrine  0.3 mg/0.3 mL IJ SOAJ injection Inject 0.3 mg into the muscle as needed for anaphylaxis. 02/02/24   Jeneal Danita Macintosh, MD  famotidine  (PEPCID ) 20 MG tablet TAKE 1 TABLET BY MOUTH TWICE A DAY 02/02/24   Jeneal Danita Macintosh, MD  ibuprofen  (ADVIL ,MOTRIN ) 600  MG tablet Take 1 tablet (600 mg total) by mouth every 6 (six) hours as needed. 10/05/17   Law, Alexandra M, PA-C  montelukast  (SINGULAIR ) 10 MG tablet Take 1 tablet (10 mg total) by mouth at bedtime. 11/04/23   Jeneal Danita Macintosh, MD  Multiple Vitamin (MULTIVITAMIN) tablet Take 2 tablets by mouth daily.    [provider]  omalizumab  (XOLAIR ) 300 MG/2  ML prefilled syringe Inject 300 mg into the skin every 14 (fourteen) days. 02/14/24   Jeneal Danita Macintosh, MD  sertraline  (ZOLOFT ) 100 MG tablet Take 100 mg by mouth daily. 09/28/20   [provider]  tirzepatide CLOYDE) 10 MG/0.5ML Pen Inject 10 mg into the skin once a week.    [provider]    Family History Family History  Problem Relation Age of Onset   Allergic rhinitis Daughter    Eczema Daughter    Angioedema Neg Hx    Asthma Neg Hx    Urticaria Neg Hx     Social History Social History   Tobacco Use   Smoking status: Never    Passive exposure: Never   Smokeless tobacco: Never  Vaping Use   Vaping status: Never Used  Substance Use Topics   Alcohol use: No   Drug use: No     Allergies   Codeine   Review of Systems Review of Systems  HENT:  Positive for dental problem.      Physical  Exam Triage Vital Signs ED Triage Vitals  Encounter Vitals Group     BP 06/28/24 1116 128/82     Girls Systolic BP Percentile --      Girls Diastolic BP Percentile --      Boys Systolic BP Percentile --      Boys Diastolic BP Percentile --      Pulse Rate 06/28/24 1116 90     Resp 06/28/24 1116 17     Temp 06/28/24 1116 98.6 F (37 C)     Temp Source 06/28/24 1116 Oral     SpO2 06/28/24 1116 97 %     Weight --      Height --      Head Circumference --      Peak Flow --      Pain Score 06/28/24 1120 9     Pain Loc --      Pain Education --      Exclude from Growth Chart --    No data found.  Updated Vital Signs BP 128/82 (BP Location: Right Arm)   Pulse 90   Temp 98.6 F (37  C) (Oral)   Resp 17   SpO2 97%   Visual Acuity Right Eye Distance:   Left Eye Distance:   Bilateral Distance:    Right Eye Near:   Left Eye Near:    Bilateral Near:     Physical Exam Vitals reviewed.  Constitutional:      General: She is not in acute distress.    Appearance: Normal appearance. She is not ill-appearing.  HENT:     Head: Normocephalic and atraumatic.     Mouth/Throat:     Dentition: Abnormal dentition. Dental tenderness, gingival swelling and dental abscesses present.     Comments: There is a 1 cm abscess of the gingiva of the left bottom incisors.  No pain under tongue. Pulmonary:     Effort: Pulmonary effort is normal.  Neurological:     General: No focal deficit present.     Mental Status: She is alert and oriented to person, place, and time.  Psychiatric:        Mood and Affect: Mood normal.        Behavior: Behavior normal.        Thought Content: Thought content normal.        Judgment: Judgment normal.      UC Treatments / Results  Labs (all labs ordered are listed, but only abnormal results are displayed) Labs Reviewed - No data to display  EKG   Radiology No results found.  Procedures Procedures (including critical care time)  Medications Ordered in UC Medications - No data to display  Initial Impression / Assessment and Plan / UC Course  I have reviewed the triage vital signs and the nursing notes.  Pertinent labs & imaging results that were available during my care of the patient were reviewed by me and considered in my medical decision making (see chart for details).     The patient is a pleasant 48 year old female presenting with dental abscess.  Afebrile, nontachycardic.  History of tubal ligation for contraception. Augmentin sent.  Follow-up with dentist as scheduled on 07/03/24.   Final Clinical Impressions(s) / UC Diagnoses   Final diagnoses:  History of bilateral tubal ligation  Dental infection      Discharge Instructions      -Start the antibiotic-Augmentin (amoxicillin-clavulanate), 1 pill every 12 hours for 7 days.  You can take this with  food like with breakfast and dinner. -You can take Tylenol  up to 1000 mg 3 times daily. Avoid NSAIDs like ibuprofen  due to allergic urticaria. -Follow-up with dentist as scheduled on 07/03/24.      ED Prescriptions     Medication Sig Dispense Auth. Provider   amoxicillin-clavulanate (AUGMENTIN) 875-125 MG tablet Take 1 tablet by mouth every 12 (twelve) hours. 14 tablet Keller Mikels E, PA-C      PDMP not reviewed this encounter.   Arlyss Leita BRAVO, PA-C 06/28/24 1215

## 2024-07-05 ENCOUNTER — Ambulatory Visit (INDEPENDENT_AMBULATORY_CARE_PROVIDER_SITE_OTHER)

## 2024-07-05 DIAGNOSIS — L501 Idiopathic urticaria: Secondary | ICD-10-CM | POA: Diagnosis not present

## 2024-07-24 ENCOUNTER — Ambulatory Visit

## 2024-08-02 ENCOUNTER — Ambulatory Visit

## 2024-08-02 DIAGNOSIS — L501 Idiopathic urticaria: Secondary | ICD-10-CM

## 2024-08-06 ENCOUNTER — Telehealth: Payer: Self-pay | Admitting: *Deleted

## 2024-08-06 NOTE — Telephone Encounter (Signed)
 Called patient and advised ok to home admin and message send to [facility]. She will need to reorder same. She advised she still has 2 doses in clinic and I advised she can pick those up to take to home to admin

## 2024-08-16 ENCOUNTER — Other Ambulatory Visit: Payer: Self-pay

## 2024-08-16 ENCOUNTER — Ambulatory Visit
Admission: EM | Admit: 2024-08-16 | Discharge: 2024-08-16 | Disposition: A | Discharge status: Home / Self Care | Source: Home / Self Care

## 2024-08-16 DIAGNOSIS — L509 Urticaria, unspecified: Secondary | ICD-10-CM

## 2024-08-16 MED ORDER — PREDNISONE 20 MG PO TABS: ORAL_TABLET | ORAL | 0 refills | Status: DC

## 2024-08-16 NOTE — Discharge Instructions (Addendum)
 You were seen today for concerns of hives along your trunk and extremities that is not resolving with your standard medication regimen.  To assist with your symptoms I am sending in a prednisone  taper for you to take as directed for the next week.  This should help reduce your symptoms and augment your existing medication regimen. Please continue taking your antihistamines and Pepcid  to further assist with reducing this reaction. If you feel like you are having more severe symptoms or if your symptoms are not resolving or return after finishing the steroid I recommend following up with your allergy specialist or your PCP. If you develop any of the following symptoms please go to the ER: Shortness of breath or difficulty breathing, facial swelling, severe GI symptoms such as abdominal pain or vomiting and diarrhea, more severe skin symptoms such as blisters or skin breakdown

## 2024-08-16 NOTE — ED Triage Notes (Signed)
 Pt presents with a chief complaint of hives. States this is a chronic medical problem for her. Typically managed with her prescribed medications. Does Xolair  injections. This flare-up feels different per pt. Very painful and she feels swollen all over. Started to the flare-up on Sunday 12/28. Symptoms have only become worse despite continuing her medications. Rates overall pain an 8/10.

## 2024-08-16 NOTE — ED Provider Notes (Signed)
 [facility]    CSN: [id number] Arrival date & time: 08/16/24  9081      History   Chief Complaint Chief Complaint  Patient presents with   Urticaria    HPI Joanna Cross is a 49 y.o. female.   HPI  Pt is here today with concerns for a flare up of hives. She states she has been having this ongoing since Feb 2025 and was evaluated by Allergy. She was started on Xolair  injections which has significantly improved her symptoms. She states currently though she is having a flare that is more severe than her normal. She reports hives that are swollen and painful and are lasting all day vs starting at night like normal. She states this current flare has been ongoing since 12/28 and she has been taking Benadryl , Zyrtec , Pepcid  and Singulair  without improvement.  She denies known recent triggers to her knowledge. She denies changes to soaps, detergents, topicals. She denies new foods or medications She denies feeling like her tongue is swelling or throat is closing, SOB or breathing symptoms, facial swelling or abdominal symptoms She reports she has an apt with Allergy on 1/19 for follow up    Past Medical History:  Diagnosis Date   Anxiety    Urticaria     Patient Active Problem List   Diagnosis Date Noted   Acute cholecystitis s/p lap cholecystectomy 04/13/2016 04/13/2016    Past Surgical History:  Procedure Laterality Date   CESAREAN SECTION     LAPAROSCOPIC CHOLECYSTECTOMY SINGLE SITE WITH INTRAOPERATIVE CHOLANGIOGRAM N/A 04/13/2016   Procedure: LAPAROSCOPIC CHOLECYSTECTOMY WITH SINGLE SITE with intraoperative cholangiogram;  Surgeon: Elspeth Schultze, MD;  Location: [facility];  Service: General;  Laterality: N/A;   TUBAL LIGATION      OB History   No obstetric history on file.      Home Medications    Prior to Admission medications  Medication Sig Start Date End Date Taking? Authorizing Provider  predniSONE  (DELTASONE ) 20 MG tablet Take 60mg  PO daily x 2 days,  then40mg  PO daily x 2 days, then 20mg  PO daily x 3 days 08/16/24  Yes Lynley Killilea E, PA-C  acetaminophen  (TYLENOL ) 500 MG tablet Take 1 tablet (500 mg total) by mouth every 6 (six) hours as needed. 10/05/17   Law, Alexandra M, PA-C  amoxicillin -clavulanate (AUGMENTIN ) 875-125 MG tablet Take 1 tablet by mouth every 12 (twelve) hours. 06/28/24   Graham, Laura E, PA-C  cetirizine  (ZYRTEC  ALLERGY) 10 MG tablet Take 1 tablet (10 mg total) by mouth 2 (two) times daily. 02/27/24   Jeneal Danita Macintosh, MD  cetirizine -pseudoephedrine  (ZYRTEC -D) 5-120 MG tablet Take 1 tablet by mouth daily. 10/05/17   Law, Alexandra M, PA-C  EPINEPHrine  0.3 mg/0.3 mL IJ SOAJ injection Inject 0.3 mg into the muscle as needed for anaphylaxis. 02/02/24   Jeneal Danita Macintosh, MD  famotidine  (PEPCID ) 20 MG tablet TAKE 1 TABLET BY MOUTH TWICE A DAY 02/02/24   Jeneal Danita Macintosh, MD  ibuprofen  (ADVIL ,MOTRIN ) 600 MG tablet Take 1 tablet (600 mg total) by mouth every 6 (six) hours as needed. 10/05/17   Law, Lorane HERO, PA-C  montelukast  (SINGULAIR ) 10 MG tablet Take 1 tablet (10 mg total) by mouth at bedtime. 11/04/23   Jeneal Danita Macintosh, MD  Multiple Vitamin (MULTIVITAMIN) tablet Take 2 tablets by mouth daily.    [provider]  omalizumab  (XOLAIR ) 300 MG/2  ML prefilled syringe Inject 300 mg into the skin every 14 (fourteen) days. 02/14/24  Jeneal Danita Macintosh, MD  sertraline  (ZOLOFT ) 100 MG tablet Take 100 mg by mouth daily. 09/28/20   [provider]  tirzepatide CLOYDE) 10 MG/0.5ML Pen Inject 10 mg into the skin once a week.    [provider]    Family History Family History  Problem Relation Age of Onset   Allergic rhinitis Daughter    Eczema Daughter    Angioedema Neg Hx    Asthma Neg Hx    Urticaria Neg Hx     Social History Social History[1]   Allergies   Codeine   Review of Systems Review of Systems  Respiratory:  Negative for chest tightness, shortness  of breath and wheezing.   Cardiovascular:  Negative for chest pain and palpitations.  Gastrointestinal:  Negative for abdominal pain, diarrhea, nausea and vomiting.  Skin:  Positive for rash.     Physical Exam Triage Vital Signs ED Triage Vitals  Encounter Vitals Group     BP 08/16/24 0942 115/81     Girls Systolic BP Percentile --      Girls Diastolic BP Percentile --      Boys Systolic BP Percentile --      Boys Diastolic BP Percentile --      Pulse Rate 08/16/24 0942 87     Resp 08/16/24 0942 18     Temp 08/16/24 0942 97.9 F (36.6 C)     Temp src --      SpO2 08/16/24 0942 96 %     Weight --      Height --      Head Circumference --      Peak Flow --      Pain Score 08/16/24 0940 8     Pain Loc --      Pain Education --      Exclude from Growth Chart --    No data found.  Updated Vital Signs BP 115/81 (BP Location: Right Arm)   Pulse 87   Temp 97.9 F (36.6 C)   Resp 18   LMP 08/03/2024 (Exact Date)   SpO2 96%   Visual Acuity Right Eye Distance:   Left Eye Distance:   Bilateral Distance:    Right Eye Near:   Left Eye Near:    Bilateral Near:     Physical Exam Vitals reviewed.  Constitutional:      General: She is awake.     Appearance: Normal appearance. She is well-developed and well-groomed.  HENT:     Head: Normocephalic and atraumatic.     Mouth/Throat:     Lips: Pink.     Mouth: Mucous membranes are moist. No angioedema.     Tongue: No lesions. Tongue does not deviate from midline.     Pharynx: Oropharynx is clear. Uvula midline.  Eyes:     General: Lids are normal. Gaze aligned appropriately.     Extraocular Movements: Extraocular movements intact.     Conjunctiva/sclera: Conjunctivae normal.  Cardiovascular:     Rate and Rhythm: Normal rate and regular rhythm.     Heart sounds: No murmur heard.    No friction rub. No gallop.  Pulmonary:     Effort: Pulmonary effort is normal. No tachypnea, accessory muscle usage, prolonged expiration,  respiratory distress or retractions.     Breath sounds: Normal breath sounds. No decreased air movement. No decreased breath sounds, wheezing, rhonchi or rales.  Musculoskeletal:     Cervical back: Normal range of motion and neck supple.  Lymphadenopathy:  Cervical: No cervical adenopathy.  Skin:    General: Skin is warm and dry.     Findings: Rash present. Rash is macular, papular and urticarial.  Neurological:     Mental Status: She is alert and oriented to person, place, and time.  Psychiatric:        Attention and Perception: Attention and perception normal.        Mood and Affect: Mood and affect normal.        Speech: Speech normal.        Behavior: Behavior normal. Behavior is cooperative.        Thought Content: Thought content normal.        Judgment: Judgment normal.      UC Treatments / Results  Labs (all labs ordered are listed, but only abnormal results are displayed) Labs Reviewed - No data to display  EKG   Radiology No results found.  Procedures Procedures (including critical care time)  Medications Ordered in UC Medications - No data to display  Initial Impression / Assessment and Plan / UC Course  I have reviewed the triage vital signs and the nursing notes.  Pertinent labs & imaging results that were available during my care of the patient were reviewed by me and considered in my medical decision making (see chart for details).      Final Clinical Impressions(s) / UC Diagnoses   Final diagnoses:  Hives of unknown origin   Patient is here with concern for flareup of high.  She reports that this has been intermittently ongoing since February 2025 and she has been evaluated by allergy specialist and started on Xolair  injections which significantly improved her symptoms.  She states that currently she started having large hives particularly on the lower extremities that started 08/12/2024 and has not responded to Benadryl , Zyrtec , Pepcid  and  Singulair .  She denies symptoms consistent with anaphylaxis or severe allergic reaction and is unsure of what might be triggering her symptoms.  Physical exam reveals evidence of large, maculopapular urticaria particularly along the upper thighs and buttocks area.  Will start patient on prednisone  taper to assist with symptoms and recommend that she continues with her home measures.  Reviewed that if symptoms are not improving or seem to be worsening she should keep her follow-up with allergy on 09/04/2023.  ED and return precautions reviewed and provided in AVS.  Follow-up as needed.    Discharge Instructions      You were seen today for concerns of hives along your trunk and extremities that is not resolving with your standard medication regimen.  To assist with your symptoms I am sending in a prednisone  taper for you to take as directed for the next week.  This should help reduce your symptoms and augment your existing medication regimen. Please continue taking your antihistamines and Pepcid  to further assist with reducing this reaction. If you feel like you are having more severe symptoms or if your symptoms are not resolving or return after finishing the steroid I recommend following up with your allergy specialist or your PCP. If you develop any of the following symptoms please go to the ER: Shortness of breath or difficulty breathing, facial swelling, severe GI symptoms such as abdominal pain or vomiting and diarrhea, more severe skin symptoms such as blisters or skin breakdown     ED Prescriptions     Medication Sig Dispense Auth. Provider   predniSONE  (DELTASONE ) 20 MG tablet Take 60mg  PO daily x 2 days, then40mg   PO daily x 2 days, then 20mg  PO daily x 3 days 13 tablet Raghav Verrilli E, PA-C      PDMP not reviewed this encounter.      [1]  Social History Tobacco Use   Smoking status: Never    Passive exposure: Never   Smokeless tobacco: Never  Vaping Use   Vaping status: Never  Used  Substance Use Topics   Alcohol use: No   Drug use: No     Marylene Rocky BRAVO, PA-C 08/20/24 0910  "

## 2024-08-17 ENCOUNTER — Ambulatory Visit
Admission: RE | Admit: 2024-08-17 | Discharge: 2024-08-17 | Disposition: A | Discharge status: Home / Self Care | Source: Ambulatory Visit | Attending: Physician Assistant | Admitting: Physician Assistant

## 2024-08-17 DIAGNOSIS — Z1231 Encounter for screening mammogram for malignant neoplasm of breast: Secondary | ICD-10-CM

## 2024-08-24 ENCOUNTER — Encounter: Payer: Self-pay | Admitting: Allergy

## 2024-08-24 ENCOUNTER — Other Ambulatory Visit: Payer: Self-pay

## 2024-08-24 ENCOUNTER — Ambulatory Visit: Admitting: Allergy

## 2024-08-24 VITALS — BP 124/80 | HR 102 | Temp 97.2°F | Resp 20 | Ht 67.0 in | Wt 292.9 lb

## 2024-08-24 DIAGNOSIS — T50905D Adverse effect of unspecified drugs, medicaments and biological substances, subsequent encounter: Secondary | ICD-10-CM

## 2024-08-24 DIAGNOSIS — Z888 Allergy status to other drugs, medicaments and biological substances status: Secondary | ICD-10-CM

## 2024-08-24 DIAGNOSIS — L501 Idiopathic urticaria: Secondary | ICD-10-CM

## 2024-08-24 NOTE — Progress Notes (Signed)
 "   Follow-up Note  RE: Joanna Cross MRN: [id number] DOB: 11/12/1975 Date of Office Visit: 08/24/2024   History of present illness: Joanna Cross is a 49 y.o. female presenting today for follow-up of urticaria.  She was last seen in the office on 05/31/24 by myself.    Discussed the use of AI scribe software for clinical note transcription with the patient, who gave verbal consent to proceed.  She experienced a severe allergic reaction characterized by hives and burning sensations after starting metformin on August 05, 2024. The symptoms intensified during the week of August 13, 2024, prompting her to seek urgent care where she was prescribed prednisone . She discontinued metformin on August 16, 2024, and completed the prednisone  course by August 21, 2024. Since stopping metformin, she notes significant improvement, with only mild itching and a few spots on her leg noted yesterday. Her current medication regimen includes Zyrtec  and Pepcid  twice a day, Singulair  daily, and monthly Xolair  injections. She occasionally takes Benadryl  at night. Her last Xolair  injection was on August 02, 2024, and she is scheduled for her next dose next week.  Prior to the metformin, her hives were well-controlled. She also mentions a sensitivity to ibuprofen , which she avoids due to its potential to release more histamine.     Review of systems: 10pt ROS negative unless noted above in HPI   Past medical/social/surgical/family history have been reviewed and are unchanged unless specifically indicated below.  No changes  Medication List: Current Outpatient Medications  Medication Sig Dispense Refill   cetirizine  (ZYRTEC  ALLERGY) 10 MG tablet Take 1 tablet (10 mg total) by mouth 2 (two) times daily. 60 tablet 5   EPINEPHrine  0.3 mg/0.3 mL IJ SOAJ injection Inject 0.3 mg into the muscle as needed for anaphylaxis. 1 each 1   famotidine  (PEPCID ) 20 MG tablet TAKE 1 TABLET BY MOUTH TWICE A DAY 180 tablet 5    montelukast  (SINGULAIR ) 10 MG tablet Take 1 tablet (10 mg total) by mouth at bedtime. 30 tablet 5   Multiple Vitamin (MULTIVITAMIN) tablet Take 2 tablets by mouth daily.     omalizumab  (XOLAIR ) 300 MG/2  ML prefilled syringe Inject 300 mg into the skin every 14 (fourteen) days. 4 mL 11   sertraline  (ZOLOFT ) 100 MG tablet Take 100 mg by mouth daily.     tirzepatide (MOUNJARO) 10 MG/0.5ML Pen Inject 10 mg into the skin once a week.     acetaminophen  (TYLENOL ) 500 MG tablet Take 1 tablet (500 mg total) by mouth every 6 (six) hours as needed. 30 tablet 0   Current Facility-Administered Medications  Medication Dose Route Frequency Provider Last Rate Last Admin   omalizumab  (XOLAIR ) prefilled syringe 300 mg  300 mg Subcutaneous Q28 days Jeneal Danita Macintosh, MD   300 mg at 08/02/24 9048     Known medication allergies: Allergies[1]   Physical examination: Blood pressure 124/80, pulse (!) 102, temperature (!) 97.2 F (36.2 C), resp. rate 20, height 5' 7 (1.702 m), weight 292 lb 14.4 oz (132.9 kg), last menstrual period 08/03/2024, SpO2 97%.  General: Alert, interactive, in no acute distress. HEENT: PERRLA, TMs pearly gray, turbinates non-edematous without discharge, post-pharynx non erythematous. Neck: Supple without lymphadenopathy. Lungs: Clear to auscultation without wheezing, rhonchi or rales. {no increased work of breathing. CV: Normal S1, S2 without murmurs. Abdomen: Nondistended, nontender. Skin: Warm and dry, without lesions or rashes. Extremities:  No clubbing, cyanosis or edema. Neuro:   Grossly intact.  Diagnostics/Labs: None today  Assessment and plan:   Urticaria (Hives), chronic with angioedema  Recent flare after Metformin start with a change of hive symptom with burning sensation.  Hives have improved with stopping Metformin and resuming high-dose antihistamine regimen.   -Continue Xolair  300mg  monthly injections at this time and have access to your epinephrine   device.   -Continue high-dose antihistamine regimen as follows:      Zyrtec  1 tab twice a day, Pepcid  1 tab twice a day and Singulair  1 tab daily.  Follow-up in 6 months or sooner if needed  I appreciate the opportunity to take part in Tomiko's care. Please do not hesitate to contact me with questions.  Sincerely,   Danita Brain, MD Allergy/Immunology [facility]       [1]  Allergies Allergen Reactions   Codeine Hives   "

## 2024-08-24 NOTE — Patient Instructions (Addendum)
 Urticaria (Hives), chronic with angioedema  Recent flare after Metformin start with a change of hive symptom with burning sensation.  Hives have improved with stopping Metformin and resuming high-dose antihistamine regimen.   -Continue Xolair  300mg  monthly injections at this time and have access to your epinephrine  device.   -Continue high-dose antihistamine regimen as follows:      Zyrtec  1 tab twice a day, Pepcid  1 tab twice a day and Singulair  1 tab daily.  Follow-up in 6 months or sooner if needed

## 2024-08-30 ENCOUNTER — Ambulatory Visit: Admitting: Allergy

## 2024-09-03 ENCOUNTER — Ambulatory Visit

## 2024-09-06 ENCOUNTER — Encounter: Payer: Self-pay | Admitting: Family

## 2024-09-06 ENCOUNTER — Ambulatory Visit: Admitting: Family Medicine

## 2024-09-06 ENCOUNTER — Ambulatory Visit: Admitting: Family

## 2024-09-06 ENCOUNTER — Ambulatory Visit: Admitting: Allergy

## 2024-09-06 VITALS — BP 116/78 | HR 102 | Temp 99.6°F | Resp 22 | Wt 294.0 lb

## 2024-09-06 DIAGNOSIS — L501 Idiopathic urticaria: Secondary | ICD-10-CM | POA: Diagnosis not present

## 2024-09-06 DIAGNOSIS — T783XXD Angioneurotic edema, subsequent encounter: Secondary | ICD-10-CM

## 2024-09-06 MED ORDER — PREDNISONE 10 MG PO TABS: ORAL_TABLET | ORAL | 0 refills | Status: AC

## 2024-09-06 NOTE — Patient Instructions (Addendum)
 Urticaria (Hives), chronic with angioedema -not well controlled Past history:  flare after Metformin start with a change of hive symptom with burning sensation.  Hives have improved with stopping Metformin and resuming high-dose antihistamine regimen.   -Start prednisone  10 mg taking 2 tablets twice a day for 3 days, then on the fourth day take 2 tablets in the morning, and on the fifth day take 1 tablet and stop -Continue Xolair  300mg  monthly injections for now, but I will send a message to Tammy, our Biologics coordinator to see if we can change your Xolair  frequency to every 2 weeks. -Have access to your epinephrine  autoinjector device at all times - Increase high-dose antihistamine regimen as follows:      Zyrtec  2 tabs twice a day, Pepcid  1 tab twice a day and Singulair  1 tab daily. - Caution as this may make you sleepy - Consider Rhapsido if hives are not controlled after frequency change of Xolair   Follow-up in 3 months or sooner if needed

## 2024-09-06 NOTE — Progress Notes (Signed)
 "  [address] [place] [address] Dept: [phone]  FOLLOW UP NOTE  Patient ID: Joanna Cross, female    DOB: [birth year]  Age: 49 y.o. MRN: [id number] Date of Office Visit: 09/06/2024  Assessment  Chief Complaint: No chief complaint on file.  HPI Joanna Cross is a 49 year old female who presents today for an acute visit of flare of hives.  She was last seen on August 24, 2024 by Dr. Jeneal for urticaria -chronic with angioedema.  She denies any new diagnosis or surgery since her last office visit.  Urticaria: She continues to receive Xolair  injections 300 mg every 4 weeks at home.  Her last injection was today.  She does her Xolair  injections at home..  She also takes Zyrtec  1 tablet 3 times a day, Pepcid  1 tablet twice a day, and Singulair  10 mg daily.  She increased her Zyrtec  to 3 times a day for the past week.  She reports that since seeing Dr. Jeneal that she is still having flares and they have gotten worse.  They are not really going away.  They are on her arms and legs.  They are super itchy and hurt.  She wonders if the pain is coming from scratching.  She is not able to sleep.  She denies any new medications, products, or foods.  She is hesitant to take more prednisone  because she is on a weight loss journey.  When she is on the prednisone  the hives do go away.  She has been stressed some.   Drug Allergies:  Allergies[1]  Review of Systems: Negative except as per HPI  Physical Exam: BP 116/78 (BP Location: Left Arm, Patient Position: Sitting, Cuff Size: Small)   Pulse (!) 102   Temp 99.6 F (37.6 C) (Oral)   Resp (!) 22   Wt 294 lb (133.4 kg)   LMP 08/03/2024 (Exact Date)   SpO2 97%   BMI 46.05 kg/m    Physical Exam Constitutional:      Appearance: Normal appearance.     Comments: Tearful during a portion of the office visit  HENT:     Head: Normocephalic and atraumatic.     Comments: Pharynx normal, eyes normal, ears normal, nose normal    Right  Ear: Tympanic membrane, ear canal and external ear normal.     Left Ear: Tympanic membrane, ear canal and external ear normal.     Nose: Nose normal.     Mouth/Throat:     Mouth: Mucous membranes are moist.     Pharynx: Oropharynx is clear.  Eyes:     Conjunctiva/sclera: Conjunctivae normal.  Cardiovascular:     Rate and Rhythm: Regular rhythm.     Heart sounds: Normal heart sounds.  Pulmonary:     Effort: Pulmonary effort is normal.     Breath sounds: Normal breath sounds.     Comments: Lungs clear to auscultation Musculoskeletal:     Cervical back: Neck supple.  Skin:    General: Skin is warm.     Comments: Urticarial lesions noted on left arm  Neurological:     Mental Status: She is alert and oriented to person, place, and time.  Psychiatric:        Mood and Affect: Mood normal.        Behavior: Behavior normal.        Thought Content: Thought content normal.        Judgment: Judgment normal.     Diagnostics:  None  Assessment and Plan: 1. Idiopathic urticaria   2. Angioedema, subsequent encounter     Meds ordered this encounter  Medications   predniSONE  (DELTASONE ) 10 MG tablet    Sig: Take 2 tablets twice a day for 3 days, then on the fourth day take 2 tablets in the morning, and on the fifth day take 1 tablet and stop.    Dispense:  15 tablet    Refill:  0    Patient Instructions  Urticaria (Hives), chronic with angioedema -not well controlled Past history:  flare after Metformin start with a change of hive symptom with burning sensation.  Hives have improved with stopping Metformin and resuming high-dose antihistamine regimen.   -Start prednisone  10 mg taking 2 tablets twice a day for 3 days, then on the fourth day take 2 tablets in the morning, and on the fifth day take 1 tablet and stop -Continue Xolair  300mg  monthly injections for now, but I will send a message to Tammy, our Biologics coordinator to see if we can change your Xolair  frequency to every 2  weeks. -Have access to your epinephrine  autoinjector device at all times - Increase high-dose antihistamine regimen as follows:      Zyrtec  2 tabs twice a day, Pepcid  1 tab twice a day and Singulair  1 tab daily. - Caution as this may make you sleepy - Consider Rhapsido if hives are not controlled after frequency change of Xolair   Follow-up in 3 months or sooner if needed  Return in about 3 months (around 12/05/2024), or if symptoms worsen or fail to improve.    Thank you for the opportunity to care for this patient.  Please do not hesitate to contact me with questions.  Wanda Craze, FNP [facility]         [1]  Allergies Allergen Reactions   Codeine Hives   Metformin And Related Hives    Large urticarial dermatitis with burning sensation   "

## 2024-09-07 ENCOUNTER — Telehealth: Payer: Self-pay | Admitting: *Deleted

## 2024-09-07 NOTE — Telephone Encounter (Signed)
 Noted. Thank you Tammy.

## 2024-09-07 NOTE — Telephone Encounter (Signed)
-----   Message from Wanda Craze, FNP sent at 09/06/2024  2:24 PM EST ----- Please see if we can change her Xolair  frequency to every 14 days. Thank you!

## 2024-09-07 NOTE — Telephone Encounter (Signed)
 Called patient and advised she is already approved for every 14 days she just needs to order same. [facility] will only send one dose at a time out probably due to getting more copay from the copay card. She will reach out about a week before she due for next dose to order from [facility]

## 2024-09-09 ENCOUNTER — Other Ambulatory Visit: Payer: Self-pay | Admitting: Allergy

## 2024-09-20 ENCOUNTER — Ambulatory Visit: Admitting: Allergy

## 2024-09-20 ENCOUNTER — Ambulatory Visit

## 2024-09-20 ENCOUNTER — Other Ambulatory Visit: Payer: Self-pay

## 2024-09-20 ENCOUNTER — Encounter: Payer: Self-pay | Admitting: Allergy

## 2024-09-20 VITALS — BP 122/88 | HR 88 | Temp 97.8°F

## 2024-09-20 DIAGNOSIS — L501 Idiopathic urticaria: Secondary | ICD-10-CM

## 2024-09-20 NOTE — Progress Notes (Unsigned)
 "   Follow-up Note  RE: Joanna Cross MRN: [id number] DOB: [birth year] Date of Office Visit: 09/20/2024   History of present illness: Joanna Cross is a 49 y.o. female presenting today for follow-up of ***  {Blank single:19197::Relevant historical results: ***, }  Review of systems: Review of Systems   {Blank single:19197:: ,All other systems negative unless noted above in HPI}  Past medical/social/surgical/family history have been reviewed and are unchanged unless specifically indicated below.  {Blank single:19197::No changes}  Medication List: Current Outpatient Medications  Medication Sig Dispense Refill   acetaminophen  (TYLENOL ) 500 MG tablet Take 1 tablet (500 mg total) by mouth every 6 (six) hours as needed. 30 tablet 0   cetirizine  (ZYRTEC ) 10 MG tablet TAKE 1 TABLET BY MOUTH TWICE A DAY 180 tablet 1   EPINEPHrine  0.3 mg/0.3 mL IJ SOAJ injection Inject 0.3 mg into the muscle as needed for anaphylaxis. 1 each 1   famotidine  (PEPCID ) 20 MG tablet TAKE 1 TABLET BY MOUTH TWICE A DAY 180 tablet 5   montelukast  (SINGULAIR ) 10 MG tablet TAKE 1 TABLET BY MOUTH EVERYDAY AT BEDTIME 90 tablet 1   Multiple Vitamin (MULTIVITAMIN) tablet Take 2 tablets by mouth daily.     omalizumab  (XOLAIR ) 300 MG/2  ML prefilled syringe Inject 300 mg into the skin every 14 (fourteen) days. 4 mL 11   predniSONE  (DELTASONE ) 10 MG tablet Take 2 tablets twice a day for 3 days, then on the fourth day take 2 tablets in the morning, and on the fifth day take 1 tablet and stop. 15 tablet 0   sertraline  (ZOLOFT ) 100 MG tablet Take 100 mg by mouth daily.     tirzepatide (MOUNJARO) 10 MG/0.5ML Pen Inject 10 mg into the skin once a week.     Current Facility-Administered Medications  Medication Dose Route Frequency Provider Last Rate Last Admin   omalizumab  (XOLAIR ) prefilled syringe 300 mg  300 mg Subcutaneous Q28 days Jeneal Danita Macintosh, MD   300 mg at 08/02/24 9048     Known medication  allergies: Allergies[1]   Physical examination: Blood pressure 122/88, pulse 88, temperature 97.8 F (36.6 C), temperature source Temporal, SpO2 99%.  General: Alert, interactive, in no acute distress. HEENT: PERRLA, TMs pearly gray, turbinates {Blank single:19197::non-edematous,edematous,edematous and pale,markedly edematous,markedly edematous and pale,moderately edematous,mildly edematous,minimally edematous} {Blank single:19197::with crusty discharge,with thick discharge,with clear discharge,without discharge}, post-pharynx {Blank single:19197::unremarkable,non erythematous,erythematous,markedly erythematous,moderately erythematous,mildly erythematous}. Neck: Supple without lymphadenopathy. Lungs: {Blank single:19197::Decreased breath sounds with expiratory wheezing bilaterally,Mildly decreased breath sounds with expiratory wheezing bilaterally,Decreased breath sounds bilaterally without wheezing, rhonchi or rales,Mildly decreased breath sounds bilaterally without wheezing, rhonchi or rales,Clear to auscultation without wheezing, rhonchi or rales}. {{Blank single:19197::increased work of breathing,no increased work of breathing}. CV: Normal S1, S2 without murmurs. Abdomen: Nondistended, nontender. Skin: {Blank single:19197::Dry, erythematous, excoriated patches on the ***,Dry, hyperpigmented, thickened patches on the ***,Dry, mildly hyperpigmented, mildly thickened patches on the ***,Scattered erythematous urticarial type lesions primarily located *** , nonvesicular,Warm and dry, without lesions or rashes}. Extremities:  No clubbing, cyanosis or edema. Neuro:   Grossly intact.  Diagnositics/Labs: Labs: ***  Spirometry: {Blank single:19197::results normal,FEV1: ***, FVC: ***, ratio consistent with ***}  Allergy testing:   Allergy testing results were read and interpreted by provider, documented by clinical  staff.   Assessment and plan: There are no Patient Instructions on file for this visit.  No follow-ups on file.  I appreciate the opportunity to take part in Joanna Cross's care. Please do not hesitate to contact me with  questions.  Sincerely,   Danita Brain, MD Allergy/Immunology [facility]      [1]  Allergies Allergen Reactions   Codeine Hives   Metformin And Related Hives    Large urticarial dermatitis with burning sensation   "

## 2024-09-20 NOTE — Patient Instructions (Signed)
 Urticaria (Hives), chronic with angioedema -not well controlled Past history:  flare after Metformin start with a change of hive symptom with burning sensation.  Hives have improved with stopping Metformin and resuming high-dose antihistamine regimen.   -Start prednisone  10 mg taking 2 tablets twice a day for 3 days, then on the fourth day take 2 tablets in the morning, and on the fifth day take 1 tablet and stop -Continue Xolair  300mg  monthly injections for now, but I will send a message to Tammy, our Biologics coordinator to see if we can change your Xolair  frequency to every 2 weeks. -Have access to your epinephrine  autoinjector device at all times - Increase high-dose antihistamine regimen as follows:      Zyrtec  2 tabs twice a day, Pepcid  1 tab twice a day and Singulair  1 tab daily. - Caution as this may make you sleepy - Consider Rhapsido if hives are not controlled after frequency change of Xolair   Follow-up in 3 months or sooner if needed

## 2024-11-01 ENCOUNTER — Ambulatory Visit: Admitting: Allergy

## 2024-12-05 ENCOUNTER — Ambulatory Visit: Admitting: Allergy

## 2025-02-27 ENCOUNTER — Ambulatory Visit: Admitting: Allergy
